# Patient Record
Sex: Male | Born: 1965 | Race: White | Hispanic: No | Marital: Married | Smoking: Former smoker
Health system: Southern US, Community
[De-identification: ages and names within clinical notes are randomized; demographics above are authoritative.]

## PROBLEM LIST (undated history)

## (undated) DIAGNOSIS — K2980 Duodenitis without bleeding: Secondary | ICD-10-CM

## (undated) DIAGNOSIS — Z91018 Allergy to other foods: Secondary | ICD-10-CM

## (undated) DIAGNOSIS — K219 Gastro-esophageal reflux disease without esophagitis: Secondary | ICD-10-CM

## (undated) DIAGNOSIS — K648 Other hemorrhoids: Secondary | ICD-10-CM

## (undated) DIAGNOSIS — K589 Irritable bowel syndrome without diarrhea: Secondary | ICD-10-CM

## (undated) DIAGNOSIS — C4490 Unspecified malignant neoplasm of skin, unspecified: Secondary | ICD-10-CM

## (undated) DIAGNOSIS — D126 Benign neoplasm of colon, unspecified: Secondary | ICD-10-CM

## (undated) DIAGNOSIS — K2970 Gastritis, unspecified, without bleeding: Secondary | ICD-10-CM

## (undated) HISTORY — DX: Gastritis, unspecified, without bleeding: K29.70

## (undated) HISTORY — DX: Allergy to other foods: Z91.018

## (undated) HISTORY — PX: CYSTOSCOPY: SUR368

## (undated) HISTORY — DX: Benign neoplasm of colon, unspecified: D12.6

## (undated) HISTORY — PX: HEMORRHOID BANDING: SHX5850

## (undated) HISTORY — DX: Irritable bowel syndrome, unspecified: K58.9

## (undated) HISTORY — DX: Unspecified malignant neoplasm of skin, unspecified: C44.90

## (undated) HISTORY — PX: OTHER SURGICAL HISTORY: SHX169

## (undated) HISTORY — PX: MULTIPLE TOOTH EXTRACTIONS: SHX2053

## (undated) HISTORY — PX: COLONOSCOPY: SHX174

## (undated) HISTORY — DX: Duodenitis without bleeding: K29.80

## (undated) HISTORY — DX: Other hemorrhoids: K64.8

---

## 2002-08-30 ENCOUNTER — Ambulatory Visit (HOSPITAL_BASED_OUTPATIENT_CLINIC_OR_DEPARTMENT_OTHER): Admission: RE | Admit: 2002-08-30 | Discharge: 2002-08-30 | Payer: Self-pay | Admitting: Urology

## 2005-04-28 ENCOUNTER — Emergency Department (HOSPITAL_COMMUNITY): Admission: EM | Admit: 2005-04-28 | Discharge: 2005-04-28 | Payer: Self-pay | Admitting: Emergency Medicine

## 2005-05-07 ENCOUNTER — Ambulatory Visit: Payer: Self-pay | Admitting: Internal Medicine

## 2005-05-08 ENCOUNTER — Encounter: Admission: RE | Admit: 2005-05-08 | Discharge: 2005-05-08 | Payer: Self-pay | Admitting: Internal Medicine

## 2005-05-21 ENCOUNTER — Ambulatory Visit: Payer: Self-pay | Admitting: Internal Medicine

## 2005-06-23 ENCOUNTER — Ambulatory Visit: Payer: Self-pay | Admitting: Internal Medicine

## 2005-06-30 ENCOUNTER — Ambulatory Visit: Payer: Self-pay | Admitting: Internal Medicine

## 2006-03-29 ENCOUNTER — Ambulatory Visit: Payer: Self-pay | Admitting: Internal Medicine

## 2006-03-30 ENCOUNTER — Encounter: Admission: RE | Admit: 2006-03-30 | Discharge: 2006-03-30 | Payer: Self-pay | Admitting: Internal Medicine

## 2006-05-05 ENCOUNTER — Ambulatory Visit: Payer: Self-pay | Admitting: Internal Medicine

## 2006-08-11 ENCOUNTER — Ambulatory Visit: Payer: Self-pay | Admitting: Internal Medicine

## 2006-12-30 ENCOUNTER — Ambulatory Visit: Payer: Self-pay | Admitting: Internal Medicine

## 2006-12-30 LAB — CONVERTED CEMR LAB: Triglycerides: 93 mg/dL (ref 0–149)

## 2007-12-07 ENCOUNTER — Ambulatory Visit: Payer: Self-pay | Admitting: Internal Medicine

## 2007-12-07 DIAGNOSIS — J069 Acute upper respiratory infection, unspecified: Secondary | ICD-10-CM | POA: Insufficient documentation

## 2007-12-07 DIAGNOSIS — F17200 Nicotine dependence, unspecified, uncomplicated: Secondary | ICD-10-CM | POA: Insufficient documentation

## 2008-01-17 ENCOUNTER — Telehealth (INDEPENDENT_AMBULATORY_CARE_PROVIDER_SITE_OTHER): Payer: Self-pay | Admitting: *Deleted

## 2008-01-17 ENCOUNTER — Emergency Department (HOSPITAL_COMMUNITY): Admission: EM | Admit: 2008-01-17 | Discharge: 2008-01-17 | Payer: Self-pay | Admitting: Emergency Medicine

## 2008-01-25 ENCOUNTER — Ambulatory Visit: Payer: Self-pay | Admitting: Internal Medicine

## 2008-01-25 LAB — CONVERTED CEMR LAB
ALT: 24 units/L (ref 0–53)
AST: 20 units/L (ref 0–37)
Albumin: 3.6 g/dL (ref 3.5–5.2)
Basophils Absolute: 0.1 10*3/uL (ref 0.0–0.1)
Cholesterol: 166 mg/dL (ref 0–200)
Creatinine, Ser: 0.9 mg/dL (ref 0.4–1.5)
Eosinophils Absolute: 0.2 10*3/uL (ref 0.0–0.7)
Eosinophils Relative: 2.2 % (ref 0.0–5.0)
GFR calc Af Amer: 118 mL/min
Glucose, Bld: 96 mg/dL (ref 70–99)
HCT: 46 % (ref 39.0–52.0)
HDL: 33.4 mg/dL — ABNORMAL LOW (ref >39.0)
LDL Cholesterol: 116 mg/dL — ABNORMAL HIGH (ref 0–99)
Lymphocytes Relative: 28.2 % (ref 12.0–46.0)
MCV: 93.7 fL (ref 78.0–100.0)
Neutro Abs: 5.8 10*3/uL (ref 1.4–7.7)
Neutrophils Relative %: 60.4 % (ref 43.0–77.0)
Potassium: 4 meq/L (ref 3.5–5.1)
RDW: 12.9 % (ref 11.5–14.6)
TSH: 1.13 microintl units/mL (ref 0.35–5.50)

## 2008-02-01 ENCOUNTER — Ambulatory Visit: Payer: Self-pay | Admitting: Internal Medicine

## 2008-02-01 DIAGNOSIS — R1319 Other dysphagia: Secondary | ICD-10-CM | POA: Insufficient documentation

## 2008-02-01 DIAGNOSIS — E785 Hyperlipidemia, unspecified: Secondary | ICD-10-CM

## 2008-02-01 DIAGNOSIS — E7800 Pure hypercholesterolemia, unspecified: Secondary | ICD-10-CM | POA: Insufficient documentation

## 2008-02-01 DIAGNOSIS — N318 Other neuromuscular dysfunction of bladder: Secondary | ICD-10-CM | POA: Insufficient documentation

## 2008-02-02 ENCOUNTER — Encounter (INDEPENDENT_AMBULATORY_CARE_PROVIDER_SITE_OTHER): Payer: Self-pay | Admitting: *Deleted

## 2008-03-06 ENCOUNTER — Encounter: Payer: Self-pay | Admitting: Internal Medicine

## 2009-12-26 ENCOUNTER — Ambulatory Visit: Payer: Self-pay | Admitting: Internal Medicine

## 2009-12-26 DIAGNOSIS — R1011 Right upper quadrant pain: Secondary | ICD-10-CM | POA: Insufficient documentation

## 2010-05-07 ENCOUNTER — Encounter: Payer: Self-pay | Admitting: Internal Medicine

## 2010-11-25 NOTE — Letter (Signed)
Summary: [facility]   Imported By: Lennie Odor 05/22/2010 11:46:20  _____________________________________________________________________  External Attachment:    Type:   Image     Comment:   External Document

## 2010-11-25 NOTE — Assessment & Plan Note (Signed)
Summary: UPPER ABD PAIN/RH......Stanley Petty   Vital Signs:  Patient profile:   45 year old male Weight:      167 pounds BMI:     21.23 Temp:     98.6 degrees F oral Pulse rate:   60 / minute Resp:     15 per minute BP sitting:   128 / 86  (left arm) Cuff size:   large  Vitals Entered By: Shonna Chock (December 26, 2009 1:32 PM) CC: Right side abdominal pain x 2 days. No known Injury, Abdominal pain Comments REVIEWED MED LIST, PATIENT AGREED DOSE AND INSTRUCTION CORRECT    CC:  Right side abdominal pain x 2 days. No known Injury and Abdominal pain.  History of Present Illness:  Abdominal Pain      This is a 45 year old man who presents with Abdominal pain < 24 hrs.  The patient denies nausea, vomiting, diarrhea, constipation, melena, hematochezia, anorexia, and hematemesis.  Stools were green X2. The location of the pain is right upper quadrant.  The pain is described as constant and dull pressure.  The patient denies the following symptoms: fever, weight loss, dysuria, chest pain, jaundice, and dark urine.  The pain is better with rest & with pressure R flank. No triggers. PMH of IBS;mother had GB disease & MGF had colon CA. Last colonoscopy 2003: tortuosity.GI consult 01/15/2010.  Allergies (verified): No Known Drug Allergies  Past History:  Past Medical History: IBS, PMH of  Review of Systems General:  Denies chills and sweats. GI:  Complains of gas; Flatulence X 1 month. GU:  Denies discharge and hematuria. Derm:  Denies lesion(s) and rash. Neuro:  Denies numbness and tingling; No radicular character.  Physical Exam  General:  Thin but well-nourished,in no acute distress; alert,appropriate and cooperative throughout examination Eyes:  No corneal or conjunctival inflammation noted.Perrla. No icterus  Mouth:  Oral mucosa and oropharynx without lesions or exudates.  Teeth in good repair. No pharyngeal erythema.  Mucocoele of uvula  Lungs:  Normal respiratory effort, chest expands  symmetrically. Lungs are clear to auscultation, no crackles or wheezes. Heart:  regular rhythm, no murmur, no gallop, no rub, no JVD, and bradycardia.   Abdomen:  Bowel sounds positive,abdomen soft and non-tender without masses, organomegaly or hernias noted. Skin:  Intact without suspicious lesions or rashes. No jaundice Cervical Nodes:  No lymphadenopathy noted Axillary Nodes:  No palpable lymphadenopathy Psych:  memory intact for recent and remote, normally interactive, and good eye contact.     Impression & Recommendations:  Problem # 1:  ABDOMINAL PAIN, RIGHT UPPER QUADRANT (ICD-789.01) ? hepatic flexure syndrome ; ? IBS variant  Complete Medication List: 1)  Over The Counter Meds  2)  Hyoscyamine Sulfate 0.125 Mg Subl (Hyoscyamine sulfate) .Stanley Petty.. 1 sl q 6 hrs as needed  Patient Instructions: 1)  Stick with a low residue diet and avoid foods that can irritate your bowels. 2)  Drink as much fluid as you can tolerate for the next few days. If symptoms persist : amylase, lipase, 3)  Hepatic Panel , 4)  CBC w/ Diff , 5)  Urine-dip . Prescriptions: HYOSCYAMINE SULFATE 0.125 MG SUBL (HYOSCYAMINE SULFATE) 1 SL q 6 hrs as needed  #30 x 1   Entered and Authorized by:   Marga Melnick MD   Signed by:   Marga Melnick MD on 12/26/2009   Method used:   Print then Give to Patient   RxID:   [id number]

## 2011-03-13 NOTE — Op Note (Signed)
NAME:  Stanley Petty, Stanley Petty                          ACCOUNT NO.:  [id number]   MEDICAL RECORD NO.:  [id number]                   PATIENT TYPE:  AMB   LOCATION:  [facility]                                 FACILITY:  [facility]   PHYSICIAN:  Valetta Fuller, M.D.               DATE OF BIRTH:  1966/06/20   DATE OF PROCEDURE:  DATE OF DISCHARGE:                                 OPERATIVE REPORT   PREOPERATIVE DIAGNOSIS:  Chronic pelvic pain syndrome.   POSTOPERATIVE DIAGNOSES:  1. Chronic pelvic pain syndrome.  2. Probable interstitial cystitis.   PROCEDURES:  1. Urethral dilation.  2. Cystoscopy.  3. Hydraulic overdistention of the bladder.  4. Instillation of Clorpactin, Marcaine, and Pyridium.   SURGEON:  Valetta Fuller, M.D.   ANESTHESIA:  General.   INDICATIONS:  The patient is a 45 year old male.  I have seen him for  several years with symptoms that were certainly suggestive of classic non-  infectious prostatitis.  He has had some objective prostatic inflammation in  the past but no evidence of infection on culture.  His symptoms have tended  to wax and wane and have been typical of chronic pelvic pain syndrome.  He  has had some frequency and other irritative voiding symptoms.  We discussed  the possibility that interstitial cystitis might be a subgroup of chronic  pelvic pain syndrome patients.  We also discussed the possibility of  cystoscopy to eliminate other pathology.  He has elected to proceed with  that evaluation for both diagnostic as well as therapeutic purposes.   DESCRIPTION OF PROCEDURE:  The patient was brought to the operating room,  where he had successful induction of general anesthesia.  He was placed in  the lithotomy position and prepped and draped in the usual manner.  The  meatus was somewhat stenotic and was therefore dilated to 26 [place].  A 22  French cystoscope was then inserted.  The patient had unremarkable anterior  urethra.  He had a fairly prominent  verumontanum but minimal lateral lobe  tissue.  There was a median bar, but it did not appear to be visually  obstructing.  The bladder showed some very mild trabecular change but was  otherwise unremarkable.  He underwent hydraulic overdistention to 80 ZOX0R  pressure for five minutes.  Capacity was 850 cc.  The patient did, indeed,  have 2-3+ diffuse glomerular hemorrhages in all four quadrants of his  bladder, and representative pictures of this were taken.  I repeated his  distention for an additional two to three minutes.  We then drained his  bladder.  We instilled some  Clorpactin under  a standard concentration via gravity drainage, a few hundred cubic  centimeters at a time.  The bladder was then drained and some Marcaine and  Pyridium were applied.  The patient was brought to the recovery room in  stable condition.  Valetta Fuller, M.D.    DSG/MEDQ  D:  08/30/2002  T:  08/30/2002  Job:  [id number]

## 2011-09-23 ENCOUNTER — Encounter: Payer: Self-pay | Admitting: Internal Medicine

## 2011-09-28 ENCOUNTER — Ambulatory Visit (INDEPENDENT_AMBULATORY_CARE_PROVIDER_SITE_OTHER): Payer: BC Managed Care – PPO | Admitting: Internal Medicine

## 2011-09-28 ENCOUNTER — Encounter: Payer: Self-pay | Admitting: Internal Medicine

## 2011-09-28 VITALS — BP 110/68 | HR 57 | Wt 167.8 lb

## 2011-09-28 DIAGNOSIS — F411 Generalized anxiety disorder: Secondary | ICD-10-CM

## 2011-09-28 DIAGNOSIS — R4589 Other symptoms and signs involving emotional state: Secondary | ICD-10-CM

## 2011-09-28 DIAGNOSIS — R197 Diarrhea, unspecified: Secondary | ICD-10-CM

## 2011-09-28 DIAGNOSIS — E785 Hyperlipidemia, unspecified: Secondary | ICD-10-CM

## 2011-09-28 DIAGNOSIS — K589 Irritable bowel syndrome without diarrhea: Secondary | ICD-10-CM

## 2011-09-28 LAB — CBC WITH DIFFERENTIAL/PLATELET
Basophils Absolute: 0.1 10*3/uL (ref 0.0–0.1)
HCT: 47.1 % (ref 39.0–52.0)
Lymphs Abs: 2.7 10*3/uL (ref 0.7–4.0)
MCHC: 33.8 g/dL (ref 30.0–36.0)
MCV: 93.9 fl (ref 78.0–100.0)
Monocytes Absolute: 0.8 10*3/uL (ref 0.1–1.0)
Platelets: 287 10*3/uL (ref 150.0–400.0)
WBC: 9.2 10*3/uL (ref 4.5–10.5)

## 2011-09-28 LAB — BASIC METABOLIC PANEL
Calcium: 9 mg/dL (ref 8.4–10.5)
GFR: 86.82 mL/min (ref >60.00)
Potassium: 4.6 mEq/L (ref 3.5–5.1)
Sodium: 140 mEq/L (ref 135–145)

## 2011-09-28 LAB — LIPID PANEL
Cholesterol: 167 mg/dL (ref 0–200)
HDL: 42.6 mg/dL (ref >39.00)
Total CHOL/HDL Ratio: 4
Triglycerides: 54 mg/dL (ref 0.0–149.0)

## 2011-09-28 LAB — TSH: TSH: 0.67 u[IU]/mL (ref 0.35–5.50)

## 2011-09-28 MED ORDER — CILIDINIUM-CHLORDIAZEPOXIDE 2.5-5 MG PO CAPS: 1.0000 | ORAL_CAPSULE | Freq: Three times a day (TID) | ORAL | Status: AC | PRN

## 2011-09-28 NOTE — Progress Notes (Signed)
Addended byPecola Lawless on: 09/28/2011 09:48 AM   Modules accepted: Orders

## 2011-09-28 NOTE — Patient Instructions (Addendum)
Please keep a diary of symptoms and signs. Enter significant symptoms, frequency, severity ( 1-10 scale), possible triggers, and response to medication, exercise or other therapeutic options as discussed.  Please take the probiotic , Align, every day until the bowels are normal. This will replace the normal bacteria which  are necessary for formation of normal stool and processing of food.  Consider  [facility]'s smoking cessation program @ [email or web address] or (548)402-2226.

## 2011-09-28 NOTE — Progress Notes (Signed)
  Subjective:    Patient ID: Stanley Petty, male    DOB: 02-22-1966, 45 y.o.   MRN: [id number]  HPI "STRESS": Onset:August 2012 with mood swings Triggers: wife diagnosed with  breast cancer 8/12 Course: progressive Anxiety:yes Depression:? 11/26 Loss of interest (Anhedonia):to some extent Panic attacks:no but 11/23 -26 as irritability ; crying spells 11/26 Insomnia:yes; up late  Anorexia:no Fatigue:yes Neurologic signs/symptoms: no headache, numbness and tingling, weakness Endocrinologic signs and symptoms:no hoarseness,  vision change, temperature intolerance, skin/hair,/nail changes. Some constipation until 11/26 then loose stool with frankly watery stool 11/26 & 12/2. Weight down ? # Family history of mental health issues, alcoholism or drug abuse:no Treatment/efficacy:no intervention        Review of Systems  He has an appointment to see his gastroenterologist, Dr. Kinnie Scales. PMH of IBS. With issues above; he has started smoking again. He denies any past medical history of similar symptoms.     Objective:   Physical Exam Gen.: Thin but healthy and well-nourished in appearance. Alert, appropriate and cooperative throughout exam. Eyes: No corneal or conjunctival inflammation noted.  Extraocular motion intact. No lid lag or proptosis. Mouth: Oral mucosa and oropharynx reveal mild erythema. Teeth in good repair. Neck: No deformities, masses, or tenderness noted. Range of motion &. Thyroid normal. Lungs: Normal respiratory effort; chest expands symmetrically. Lungs are clear to auscultation without rales, wheezes, or increased work of breathing. Heart: Normal rate and rhythm. Normal S1 and S2. No gallop, click, or rub. Faint Grade 1/2 systolic apical murmur. Abdomen: Bowel sounds normal; abdomen soft and nontender. No masses, organomegaly or hernias noted.                                                                                Musculoskeletal/extremities: No deformity or  scoliosis noted of  the thoracic or lumbar spine. No clubbing, cyanosis, edema, or deformity noted. Joints normal. Nail health  good. Vascular: Carotid, radial artery, dorsalis pedis and  posterior tibial pulses are full and equal. No bruits present. Neurologic: Alert and oriented x3. Deep tendon reflexes symmetrical and normal.          Skin: Intact without suspicious lesions or rashes. No jaundice Lymph: No cervical, axillary  lymphadenopathy present. Psych: Mood and affect are normal. Normally interactive                                                                                         Assessment & Plan:  #1 stress, exogenous. Options discussed  #2 irritable bowel syndrome ; exacerbation due to #1  #3 active smoker  Plan: See orders and recommendations.

## 2011-10-06 ENCOUNTER — Telehealth: Payer: Self-pay | Admitting: Internal Medicine

## 2011-10-06 ENCOUNTER — Other Ambulatory Visit: Payer: Self-pay | Admitting: Gastroenterology

## 2011-10-06 NOTE — Telephone Encounter (Signed)
Patient had lab [phone] - please address patient wants copy mailed

## 2011-10-12 ENCOUNTER — Ambulatory Visit
Admission: RE | Admit: 2011-10-12 | Discharge: 2011-10-12 | Disposition: A | Discharge status: Home / Self Care | Payer: BC Managed Care – PPO | Source: Ambulatory Visit | Attending: Gastroenterology | Admitting: Gastroenterology

## 2011-10-15 ENCOUNTER — Other Ambulatory Visit (HOSPITAL_COMMUNITY): Payer: Self-pay | Admitting: Gastroenterology

## 2011-11-06 ENCOUNTER — Encounter (HOSPITAL_COMMUNITY)
Admission: RE | Admit: 2011-11-06 | Discharge: 2011-11-06 | Disposition: A | Discharge status: Home / Self Care | Payer: BC Managed Care – PPO | Source: Ambulatory Visit | Attending: Gastroenterology | Admitting: Gastroenterology

## 2011-11-06 DIAGNOSIS — R109 Unspecified abdominal pain: Secondary | ICD-10-CM | POA: Insufficient documentation

## 2011-11-06 MED ORDER — TECHNETIUM TC 99M MEBROFENIN IV KIT
5.4000 | PACK | Freq: Once | INTRAVENOUS | Status: AC | PRN
  Administered 2011-11-06: 5 via INTRAVENOUS

## 2014-09-12 ENCOUNTER — Ambulatory Visit (INDEPENDENT_AMBULATORY_CARE_PROVIDER_SITE_OTHER): Payer: BC Managed Care – PPO | Admitting: Internal Medicine

## 2014-09-12 ENCOUNTER — Encounter: Payer: Self-pay | Admitting: Internal Medicine

## 2014-09-12 VITALS — BP 112/64 | HR 75 | Temp 98.0°F | Ht 76.0 in | Wt 169.2 lb

## 2014-09-12 DIAGNOSIS — Z9189 Other specified personal risk factors, not elsewhere classified: Secondary | ICD-10-CM

## 2014-09-12 DIAGNOSIS — Z0000 Encounter for general adult medical examination without abnormal findings: Secondary | ICD-10-CM | POA: Insufficient documentation

## 2014-09-12 NOTE — Progress Notes (Signed)
Subjective:    Patient ID: Stanley Petty, male    DOB: 1965-11-29, 48 y.o.   MRN: [id number]  DOS:  09/12/2014 Type of visit - description : CPX, new pt to me, transferring from Dr Linna Darner  Interval history: In addition to the CPX he has multiple concerns including a skin lesion at the neck, "wax in the ears". Wonders if he needs to be tested for Lyme disease, history of tick bites but no symptoms. Concern about his heart , stress test?   ROS  No  CP, SOB No palpitations Denies  nausea, vomiting diarrhea, blood in the stools (-) cough, sputum production (-) wheezing, chest congestion No anxiety, depression No headaches Denies dizziness     Past Medical History  Diagnosis Date  . IBS (irritable bowel syndrome)     Dr Earlean Shawl    Past Surgical History  Procedure Laterality Date  . Surgery on testicle      at age 35  . Cystoscopy      Dr Risa Grill    History   Social History  . Marital Status: Married    Spouse Name: N/A    Number of Children: 2  . Years of Education: N/A   Occupational History  . plumbing, has his own co    Social History Main Topics  . Smoking status: Current Every Day Smoker -- 1.00 packs/day  . Smokeless tobacco: Never Used  . Alcohol Use: 0.0 oz/week    0 Not specified per week     Comment: socially   . Drug Use: No  . Sexual Activity: Not on file   Other Topics Concern  . Not on file   Social History Narrative     Family History  Problem Relation Age of Onset  . Heart disease Father     MI  . Hyperlipidemia Sister   . Coronary artery disease Paternal Uncle   . Colon cancer Other     GF  . Colon polyps Mother     also gallstones  . Diabetes Neg Hx   . Prostate cancer Father        Medication List    Notice  As of 09/12/2014 11:59 PM   You have not been prescribed any medications.         Objective:   Physical Exam  Skin:      BP 112/64 mmHg  Pulse 75  Temp(Src) 98 F (36.7 C) (Oral)  Ht 6\' 4"  (1.93 m)  Wt  169 lb 4 oz (76.771 kg)  BMI 20.61 kg/m2  SpO2 97% General -- alert, well-developed, NAD.  Neck --no thyromegaly , normal carotid pulse  HEENT-- Not pale.  R Ear-- normal L ear-- normal Lungs -- normal respiratory effort, no intercostal retractions, no accessory muscle use, and normal breath sounds.  Heart-- normal rate, regular rhythm, no murmur.  Abdomen-- Not distended, good bowel sounds,soft, non-tender. Extremities-- no pretibial edema bilaterally  Neurologic--  alert & oriented X3. Speech normal, gait appropriate for age, strength symmetric and appropriate for age.  Psych-- Cognition and judgment appear intact. Cooperative with normal attention span and concentration. No anxious or depressed appearing.     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 Offered dermatology referral but he declined this time  Check for Lyme disease? I don't recommend that unless he has tick bite with  rash or any symptoms consistent with lyme  Wax in the ears?  No evidence of wax in this time

## 2014-09-12 NOTE — Assessment & Plan Note (Addendum)
Td ~ 2012 at ortho per pt  Declined a flu shot Labs including a hepatitis c screening per patient list Discussed diet and exercise  Prostate cancer screening: See Dr. Risa Grill regularly Had a colonoscopy 10-2011, 1 polyp, was told to repeat in 3-5 years. Patient not sure. GI is Dr. Cher Nakai  + family history of heart disease at early age, patient is a smoker. Concerned about his heart. Strongly recommend to quit tobacco ekg-- sinus brady Will get a Stress test

## 2014-09-12 NOTE — Progress Notes (Signed)
Pre visit review using our clinic review tool, if applicable. No additional management support is needed unless otherwise documented below in the visit note. 

## 2014-09-13 ENCOUNTER — Telehealth: Payer: Self-pay | Admitting: Internal Medicine
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
## 2014-09-13 NOTE — Telephone Encounter (Signed)
Pt was seen yesterday, states dr. Larose Kells told him to come back and get his labs done, order not in the system.

## 2014-09-13 NOTE — Telephone Encounter (Signed)
Orders are in now

## 2014-09-13 NOTE — Telephone Encounter (Signed)
emmi emailed °
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
## 2014-09-14 ENCOUNTER — Other Ambulatory Visit (INDEPENDENT_AMBULATORY_CARE_PROVIDER_SITE_OTHER): Payer: BC Managed Care – PPO

## 2014-09-14 ENCOUNTER — Encounter: Payer: Self-pay | Admitting: Internal Medicine

## 2014-09-14 DIAGNOSIS — Z0000 Encounter for general adult medical examination without abnormal findings: Secondary | ICD-10-CM

## 2014-09-14 LAB — COMPREHENSIVE METABOLIC PANEL
ALT: 19 U/L (ref 0–53)
AST: 19 U/L (ref 0–37)
Albumin: 3.9 g/dL (ref 3.5–5.2)
Alkaline Phosphatase: 72 U/L (ref 39–117)
BUN: 13 mg/dL (ref 6–23)
CALCIUM: 8.8 mg/dL (ref 8.4–10.5)
CO2: 26 mEq/L (ref 19–32)
CREATININE: 1.1 mg/dL (ref 0.4–1.5)
Chloride: 108 mEq/L (ref 96–112)
GFR: 78.34 mL/min (ref >60.00)
Glucose, Bld: 96 mg/dL (ref 70–99)
Potassium: 3.9 mEq/L (ref 3.5–5.1)
SODIUM: 139 meq/L (ref 135–145)
Total Bilirubin: 0.4 mg/dL (ref 0.2–1.2)
Total Protein: 6.5 g/dL (ref 6.0–8.3)

## 2014-09-14 LAB — LIPID PANEL
Cholesterol: 180 mg/dL (ref 0–200)
HDL: 45.6 mg/dL (ref >39.00)
LDL Cholesterol: 126 mg/dL — ABNORMAL HIGH (ref 0–99)
NonHDL: 134.4
Total CHOL/HDL Ratio: 4
Triglycerides: 44 mg/dL (ref 0.0–149.0)
VLDL: 8.8 mg/dL (ref 0.0–40.0)

## 2014-09-14 LAB — CBC WITH DIFFERENTIAL/PLATELET
Basophils Absolute: 0.1 10*3/uL (ref 0.0–0.1)
Basophils Relative: 0.6 % (ref 0.0–3.0)
Eosinophils Absolute: 0.2 10*3/uL (ref 0.0–0.7)
Eosinophils Relative: 2.4 % (ref 0.0–5.0)
HEMATOCRIT: 46.5 % (ref 39.0–52.0)
HEMOGLOBIN: 15.7 g/dL (ref 13.0–17.0)
Lymphocytes Relative: 30.7 % (ref 12.0–46.0)
Lymphs Abs: 2.8 10*3/uL (ref 0.7–4.0)
MCHC: 33.8 g/dL (ref 30.0–36.0)
MCV: 93.4 fl (ref 78.0–100.0)
MONOS PCT: 7.7 % (ref 3.0–12.0)
Monocytes Absolute: 0.7 10*3/uL (ref 0.1–1.0)
Neutro Abs: 5.3 10*3/uL (ref 1.4–7.7)
Neutrophils Relative %: 58.6 % (ref 43.0–77.0)
Platelets: 237 10*3/uL (ref 150.0–400.0)
RBC: 4.98 Mil/uL (ref 4.22–5.81)
RDW: 13.9 % (ref 11.5–15.5)
WBC: 9 10*3/uL (ref 4.0–10.5)

## 2014-09-14 LAB — TSH: TSH: 0.83 u[IU]/mL (ref 0.35–4.50)

## 2014-10-12 ENCOUNTER — Encounter (HOSPITAL_COMMUNITY): Payer: BC Managed Care – PPO

## 2014-11-02 ENCOUNTER — Telehealth (HOSPITAL_COMMUNITY): Payer: Self-pay

## 2014-11-02 NOTE — Telephone Encounter (Signed)
Encounter complete. 

## 2014-11-06 ENCOUNTER — Telehealth (HOSPITAL_COMMUNITY): Payer: Self-pay

## 2014-11-06 NOTE — Telephone Encounter (Signed)
Encounter complete. 

## 2014-11-07 ENCOUNTER — Ambulatory Visit (HOSPITAL_COMMUNITY)
Admission: RE | Admit: 2014-11-07 | Discharge: 2014-11-07 | Disposition: A | Discharge status: Home / Self Care | Payer: BLUE CROSS/BLUE SHIELD | Source: Ambulatory Visit | Attending: Internal Medicine | Admitting: Internal Medicine

## 2014-11-07 DIAGNOSIS — Z9189 Other specified personal risk factors, not elsewhere classified: Secondary | ICD-10-CM

## 2014-11-07 NOTE — Procedures (Signed)
Exercise Treadmill Test    Test  Exercise Tolerance Test Ordering MD: Unice Cobble, MD  Interpreting MD:   Unique Test No1 Treadmill:  1  Indication for ETT: Family History of CAD-At Risk for Heart Disease  Contraindication to ETT: No   Stress Modality: exercise - treadmill  Cardiac Imaging Performed: non   Protocol: standard Bruce - maximal  Max BP:  171/92  Max MPHR (bpm): 170 85% MPR (bpm): 144  MPHR obtained (bpm): 173 % MPHR obtained:  101  Reached 85% MPHR (min:sec): 11:15 Total Exercise Time (min-sec): 13:02  Workload in METS:  15.30 Borg Scale:   Reason ETT Terminated: leg fatigue    ST Segment Analysis At Rest: normal ST segments - no evidence of significant ST depression With Exercise: no evidence of significant ST depression  Other Information Arrhythmia:  No Angina during ETT:  absent (0) Quality of ETT:  diagnostic  ETT Interpretation:  normal - no evidence of ischemia by ST analysis  Comments: Excellent exercise tolerance No chest pain No ischemic EKG changes Duke Treadmill Score of +13  Pixie Casino, MD, [facility] Attending Cardiologist [place]

## 2015-03-12 ENCOUNTER — Encounter: Payer: Self-pay | Admitting: Internal Medicine

## 2015-03-12 ENCOUNTER — Ambulatory Visit (INDEPENDENT_AMBULATORY_CARE_PROVIDER_SITE_OTHER): Payer: BLUE CROSS/BLUE SHIELD | Admitting: Internal Medicine

## 2015-03-12 VITALS — BP 120/76 | HR 74 | Temp 98.2°F | Ht 76.0 in | Wt 163.2 lb

## 2015-03-12 DIAGNOSIS — J0100 Acute maxillary sinusitis, unspecified: Secondary | ICD-10-CM | POA: Diagnosis not present

## 2015-03-12 MED ORDER — AZITHROMYCIN 250 MG PO TABS: ORAL_TABLET | ORAL | Status: DC

## 2015-03-12 NOTE — Progress Notes (Signed)
   Subjective:    Patient ID: Stanley Petty, male    DOB: 12/27/1965, 49 y.o.   MRN: [id number]  DOS:  03/12/2015 Type of visit - description : acute Interval history: Symptoms started about 10 days ago with runny nose, single episode of diarrhea and in general feeling poorly like he had a cold. 3-4 days ago developed a very thick/green nasal discharge and right facial pain but no dental pain per se. Yesterday he had a headache. This morning feels overall better, a right facial pain and headache are much decrease, he still has some nasal discharge. At least one of his coworkers had similar symptoms.   Review of Systems  Denies fever, chills? No sore throat. Mild cough with no sputum production No double vision.  Past Medical History  Diagnosis Date  . IBS (irritable bowel syndrome)     Dr Earlean Shawl    Past Surgical History  Procedure Laterality Date  . Surgery on testicle      at age 47  . Cystoscopy      Dr Risa Grill    History   Social History  . Marital Status: Married    Spouse Name: N/A  . Number of Children: 2  . Years of Education: N/A   Occupational History  . plumbing, has his own co    Social History Main Topics  . Smoking status: Current Every Day Smoker -- 1.00 packs/day  . Smokeless tobacco: Never Used  . Alcohol Use: 0.0 oz/week    0 Standard drinks or equivalent per week     Comment: socially   . Drug Use: No  . Sexual Activity: Not on file   Other Topics Concern  . Not on file   Social History Narrative        Medication List       This list is accurate as of: 03/12/15  3:36 PM.  Always use your most recent med list.               azithromycin 250 MG tablet  Commonly known as:  ZITHROMAX Z-PAK  2 tabs a day the first day, then 1 tab a day x 4 days           Objective:   Physical Exam BP 120/76 mmHg  Pulse 74  Temp(Src) 98.2 F (36.8 C) (Oral)  Ht 6\' 4"  (1.93 m)  Wt 163 lb 4 oz (74.05 kg)  BMI 19.88 kg/m2  SpO2 97% General:    Well developed, well nourished . NAD.  HEENT:  Normocephalic . Face symmetric, atraumatic. No systolic congested, maxillary sinuses not TTP. Tympanic members normal, throat symmetric, no redness or discharge. EOMI Lungs:  CTA B Normal respiratory effort, no intercostal retractions, no accessory muscle use. Heart: RRR,  no murmur.  no pretibial edema bilaterally  Neurologic:  alert & oriented X3.  Speech normal, gait appropriate for age and unassisted Psych--  Cognition and judgment appear intact.  Cooperative with normal attention span and concentration.  Behavior appropriate. No anxious or depressed appearing.       Assessment & Plan:    Sinusitis, Most likely symptoms related to mild sinusitis. See instructions. If not better she will let me know.  + Family history of heart disease, stress test 10-2014 negative

## 2015-03-12 NOTE — Progress Notes (Signed)
Pre visit review using our clinic review tool, if applicable. No additional management support is needed unless otherwise documented below in the visit note. 

## 2015-03-12 NOTE — Patient Instructions (Signed)
Rest, fluids , tylenol  take Mucinex   twice a day until better    use OTC Nasocort or Flonase : 2 nasal sprays on each side of the nose daily until you feel better   Take the antibiotic as prescribed  (zithromax) Call if not gradually better over the next  10 days Call anytime if the symptoms are severe

## 2016-02-14 ENCOUNTER — Telehealth: Payer: Self-pay | Admitting: Behavioral Health

## 2016-02-14 NOTE — Telephone Encounter (Signed)
Unable to reach patient at time of Pre-Visit Call.  Left message for patient to return call when available.    

## 2016-02-17 ENCOUNTER — Ambulatory Visit (INDEPENDENT_AMBULATORY_CARE_PROVIDER_SITE_OTHER): Payer: BLUE CROSS/BLUE SHIELD | Admitting: Internal Medicine

## 2016-02-17 ENCOUNTER — Encounter: Payer: Self-pay | Admitting: Internal Medicine

## 2016-02-17 VITALS — BP 124/74 | HR 55 | Temp 97.9°F | Resp 14 | Ht 76.0 in | Wt 170.6 lb

## 2016-02-17 DIAGNOSIS — Z0000 Encounter for general adult medical examination without abnormal findings: Secondary | ICD-10-CM | POA: Diagnosis not present

## 2016-02-17 DIAGNOSIS — Z09 Encounter for follow-up examination after completed treatment for conditions other than malignant neoplasm: Secondary | ICD-10-CM | POA: Insufficient documentation

## 2016-02-17 LAB — CBC WITH DIFFERENTIAL/PLATELET
BASOS PCT: 0.6 % (ref 0.0–3.0)
Basophils Absolute: 0.1 10*3/uL (ref 0.0–0.1)
EOS ABS: 0.2 10*3/uL (ref 0.0–0.7)
Eosinophils Relative: 2.3 % (ref 0.0–5.0)
HEMATOCRIT: 47.3 % (ref 39.0–52.0)
Hemoglobin: 15.6 g/dL (ref 13.0–17.0)
LYMPHS ABS: 2.8 10*3/uL (ref 0.7–4.0)
LYMPHS PCT: 27.5 % (ref 12.0–46.0)
MCHC: 33.1 g/dL (ref 30.0–36.0)
MCV: 93.3 fl (ref 78.0–100.0)
MONO ABS: 0.8 10*3/uL (ref 0.1–1.0)
Monocytes Relative: 7.5 % (ref 3.0–12.0)
NEUTROS ABS: 6.4 10*3/uL (ref 1.4–7.7)
Neutrophils Relative %: 62.1 % (ref 43.0–77.0)
PLATELETS: 255 10*3/uL (ref 150.0–400.0)
RBC: 5.07 Mil/uL (ref 4.22–5.81)
RDW: 14 % (ref 11.5–15.5)
WBC: 10.3 10*3/uL (ref 4.0–10.5)

## 2016-02-17 LAB — BASIC METABOLIC PANEL
BUN: 11 mg/dL (ref 6–23)
CHLORIDE: 105 meq/L (ref 96–112)
CO2: 26 meq/L (ref 19–32)
Calcium: 9.4 mg/dL (ref 8.4–10.5)
Creatinine, Ser: 0.95 mg/dL (ref 0.40–1.50)
GFR: 88.4 mL/min (ref >60.00)
Glucose, Bld: 90 mg/dL (ref 70–99)
POTASSIUM: 4.8 meq/L (ref 3.5–5.1)
SODIUM: 139 meq/L (ref 135–145)

## 2016-02-17 LAB — LIPID PANEL
CHOL/HDL RATIO: 4
Cholesterol: 182 mg/dL (ref 0–200)
HDL: 40.8 mg/dL (ref >39.00)
LDL CALC: 121 mg/dL — AB (ref 0–99)
NonHDL: 141.1
TRIGLYCERIDES: 100 mg/dL (ref 0.0–149.0)
VLDL: 20 mg/dL (ref 0.0–40.0)

## 2016-02-17 LAB — PSA: PSA: 0.67 ng/mL (ref 0.10–4.00)

## 2016-02-17 NOTE — Assessment & Plan Note (Addendum)
Td ~ 2012 at ortho per pt   Prostate cancer screening: See Dr. Risa Grill regularly, no recent visit but pt states he will see him this year. Pt request a PSA (will fax to urology) Had a colonoscopy 10-2011, 1 polyp, was told to repeat in 3-5 years. Saw  Dr. Cher Nakai 2 weeks ago per pt, to schedule a cscope soon  + family history of heart disease at early age, patient is a smoker-- exercise stress test (-) 2016 LDL goal close to 100. Start ASA  Tobacco cessation discussed !   He remains active, no routine exercise, encouraged healthy diet.

## 2016-02-17 NOTE — Progress Notes (Signed)
Pre visit review using our clinic review tool, if applicable. No additional management support is needed unless otherwise documented below in the visit note. 

## 2016-02-17 NOTE — Assessment & Plan Note (Signed)
Hemorrhoids: Recent rectal bleeding, to have a colonoscopy per GI soon RTC one year

## 2016-02-17 NOTE — Progress Notes (Signed)
Subjective:    Patient ID: Stanley Petty, male    DOB: 27-May-1966, 50 y.o.   MRN: [id number]  DOS:  02/17/2016 Type of visit - description : CPX Interval history No major concerns, still smoking.     Review of Systems Constitutional: No fever. No chills. No unexplained wt changes. No unusual sweats  HEENT: No dental problems, no ear discharge, no facial swelling, no voice changes. No eye discharge, no eye  redness , no  intolerance to light   Respiratory: No wheezing , no  difficulty breathing. No cough , no mucus production  Cardiovascular: No CP, no leg swelling , no  Palpitations  GI: no nausea, no vomiting, no diarrhea , no  abdominal pain.  Occasional rectal bleeding, already discuss with GI, they are planning a colonoscopy. No dysphagia, no odynophagia    Endocrine: No polyphagia, no polyuria , no polydipsia  GU: No dysuria, gross hematuria, difficulty urinating. No urinary urgency, no frequency.  Musculoskeletal: No joint swellings or unusual aches or pains  Skin: No change in the color of the skin, palor , no  Rash  Allergic, immunologic: No environmental allergies , no  food allergies  Neurological: No dizziness no  syncope. No headaches. No diplopia, no slurred, no slurred speech, no motor deficits, no facial  Numbness  Hematological: No enlarged lymph nodes, no easy bruising , no unusual bleedings  Psychiatry: No suicidal ideas, no hallucinations, no beavior problems, no confusion.  No unusual/severe anxiety, no depression   Past Medical History  Diagnosis Date  . IBS (irritable bowel syndrome)     Dr Earlean Shawl    Past Surgical History  Procedure Laterality Date  . Surgery on testicle      at age 10  . Cystoscopy      Dr Risa Grill    Social History   Social History  . Marital Status: Married    Spouse Name: N/A  . Number of Children: 2  . Years of Education: N/A   Occupational History  . plumbing, has his own co    Social History Main Topics    . Smoking status: Current Every Day Smoker -- 1.00 packs/day    Types: Cigarettes  . Smokeless tobacco: Never Used     Comment: 1 ppd  . Alcohol Use: 0.0 oz/week    0 Standard drinks or equivalent per week     Comment: socially   . Drug Use: No  . Sexual Activity: Not on file   Other Topics Concern  . Not on file   Social History Narrative     Family History  Problem Relation Age of Onset  . Heart disease Father     MI  . Hyperlipidemia Sister   . Coronary artery disease Paternal Uncle   . Colon cancer Other     GF  . Colon polyps Mother     also gallstones  . Diabetes Neg Hx   . Prostate cancer Father     dx ~ 80       Medication List    Notice  As of 02/17/2016 12:48 PM   You have not been prescribed any medications.         Objective:   Physical Exam BP 124/74 mmHg  Pulse 55  Temp(Src) 97.9 F (36.6 C) (Oral)  Resp 14  Ht 6\' 4"  (1.93 m)  Wt 170 lb 9.6 oz (77.384 kg)  BMI 20.77 kg/m2  SpO2 98%  General:   Well developed, well nourished .  NAD.  Neck: No  Thyromegaly  HEENT:  Normocephalic . Face symmetric, atraumatic Lungs:  CTA B Normal respiratory effort, no intercostal retractions, no accessory muscle use. Heart: RRR,  no murmur.  No pretibial edema bilaterally  Abdomen:  Not distended, soft, non-tender. No rebound or rigidity.   Skin: Exposed areas without rash. Not pale. Not jaundice Neurologic:  alert & oriented X3.  Speech normal, gait appropriate for age and unassisted Strength symmetric and appropriate for age.  Psych: Cognition and judgment appear intact.  Cooperative with normal attention span and concentration.  Behavior appropriate. No anxious or depressed appearing.    Assessment & Plan:   Assessment IBS, Dr. Cher Nakai Hemorrhoids, h/o banding   PLAN Hemorrhoids: Recent rectal bleeding, to have a colonoscopy per GI soon RTC one year

## 2016-02-17 NOTE — Patient Instructions (Signed)
GO TO THE LAB :      Get the blood work     GO TO THE FRONT DESK  Schedule your next appointment for a  physical exam, fasting in one year  Think about the gum or   patch for nicotine supplements, Chantix or Wellbutrin   Start an aspirin 81 mg daily     Steps to Quit Smoking  Smoking tobacco can be harmful to your health and can affect almost every organ in your body. Smoking puts you, and those around you, at risk for developing many serious chronic diseases. Quitting smoking is difficult, but it is one of the best things that you can do for your health. It is never too late to quit. WHAT ARE THE BENEFITS OF QUITTING SMOKING? When you quit smoking, you lower your risk of developing serious diseases and conditions, such as:  Lung cancer or lung disease, such as COPD.  Heart disease.  Stroke.  Heart attack.  Infertility.  Osteoporosis and bone fractures. Additionally, symptoms such as coughing, wheezing, and shortness of breath may get better when you quit. You may also find that you get sick less often because your body is stronger at fighting off colds and infections. If you are pregnant, quitting smoking can help to reduce your chances of having a baby of low birth weight. HOW DO I GET READY TO QUIT? When you decide to quit smoking, create a plan to make sure that you are successful. Before you quit:  Pick a date to quit. Set a date within the next two weeks to give you time to prepare.  Write down the reasons why you are quitting. Keep this list in places where you will see it often, such as on your bathroom mirror or in your car or wallet.  Identify the people, places, things, and activities that make you want to smoke (triggers) and avoid them. Make sure to take these actions:  Throw away all cigarettes at home, at work, and in your car.  Throw away smoking accessories, such as Scientist, research (medical).  Clean your car and make sure to empty the ashtray.  Clean your  home, including curtains and carpets.  Tell your family, friends, and coworkers that you are quitting. Support from your loved ones can make quitting easier.  Talk with your health care provider about your options for quitting smoking.  Find out what treatment options are covered by your health insurance. WHAT STRATEGIES CAN I USE TO QUIT SMOKING?  Talk with your healthcare provider about different strategies to quit smoking. Some strategies include:  Quitting smoking altogether instead of gradually lessening how much you smoke over a period of time. Research shows that quitting "cold [place]" is more successful than gradually quitting.  Attending in-person counseling to help you build problem-solving skills. You are more likely to have success in quitting if you attend several counseling sessions. Even short sessions of 10 minutes can be effective.  Finding resources and support systems that can help you to quit smoking and remain smoke-free after you quit. These resources are most helpful when you use them often. They can include:  Online chats with a Social worker.  Telephone quitlines.  Printed Furniture conservator/restorer.  Support groups or group counseling.  Text messaging programs.  Mobile phone applications.  Taking medicines to help you quit smoking. (If you are pregnant or breastfeeding, talk with your health care provider first.) Some medicines contain nicotine and some do not. Both types of medicines help  with cravings, but the medicines that include nicotine help to relieve withdrawal symptoms. Your health care provider may recommend:  Nicotine patches, gum, or lozenges.  Nicotine inhalers or sprays.  Non-nicotine medicine that is taken by mouth. Talk with your health care provider about combining strategies, such as taking medicines while you are also receiving in-person counseling. Using these two strategies together makes you more likely to succeed in quitting than if you used  either strategy on its own. If you are pregnant or breastfeeding, talk with your health care provider about finding counseling or other support strategies to quit smoking. Do not take medicine to help you quit smoking unless told to do so by your health care provider. WHAT THINGS CAN I DO TO MAKE IT EASIER TO QUIT? Quitting smoking might feel overwhelming at first, but there is a lot that you can do to make it easier. Take these important actions:  Reach out to your family and friends and ask that they support and encourage you during this time. Call telephone quitlines, reach out to support groups, or work with a counselor for support.  Ask people who smoke to avoid smoking around you.  Avoid places that trigger you to smoke, such as bars, parties, or smoke-break areas at work.  Spend time around people who do not smoke.  Lessen stress in your life, because stress can be a smoking trigger for some people. To lessen stress, try:  Exercising regularly.  Deep-breathing exercises.  Yoga.  Meditating.  Performing a body scan. This involves closing your eyes, scanning your body from head to toe, and noticing which parts of your body are particularly tense. Purposefully relax the muscles in those areas.  Download or purchase mobile phone or tablet apps (applications) that can help you stick to your quit plan by providing reminders, tips, and encouragement. There are many free apps, such as QuitGuide from the [employer] Office manager for Disease Control and Prevention). You can find other support for quitting smoking (smoking cessation) through smokefree.gov and other websites. HOW WILL I FEEL WHEN I QUIT SMOKING? Within the first 24 hours of quitting smoking, you may start to feel some withdrawal symptoms. These symptoms are usually most noticeable 2-3 days after quitting, but they usually do not last beyond 2-3 weeks. Changes or symptoms that you might experience include:  Mood swings.  Restlessness,  anxiety, or irritation.  Difficulty concentrating.  Dizziness.  Strong cravings for sugary foods in addition to nicotine.  Mild weight gain.  Constipation.  Nausea.  Coughing or a sore throat.  Changes in how your medicines work in your body.  A depressed mood.  Difficulty sleeping (insomnia). After the first 2-3 weeks of quitting, you may start to notice more positive results, such as:  Improved sense of smell and taste.  Decreased coughing and sore throat.  Slower heart rate.  Lower blood pressure.  Clearer skin.  The ability to breathe more easily.  Fewer sick days. Quitting smoking is very challenging for most people. Do not get discouraged if you are not successful the first time. Some people need to make many attempts to quit before they achieve long-term success. Do your best to stick to your quit plan, and talk with your health care provider if you have any questions or concerns.   This information is not intended to replace advice given to you by your health care provider. Make sure you discuss any questions you have with your health care provider.   Document Released: 10/06/2001  Document Revised: 02/26/2015 Document Reviewed: 02/26/2015 Elsevier Interactive Patient Education [employer].

## 2016-02-18 ENCOUNTER — Encounter: Payer: Self-pay | Admitting: *Deleted

## 2016-04-30 ENCOUNTER — Ambulatory Visit (INDEPENDENT_AMBULATORY_CARE_PROVIDER_SITE_OTHER): Payer: BLUE CROSS/BLUE SHIELD | Admitting: Family

## 2016-04-30 ENCOUNTER — Encounter: Payer: Self-pay | Admitting: Family

## 2016-04-30 VITALS — BP 124/70 | HR 64 | Temp 98.3°F | Ht 76.0 in | Wt 163.1 lb

## 2016-04-30 DIAGNOSIS — W57XXXA Bitten or stung by nonvenomous insect and other nonvenomous arthropods, initial encounter: Secondary | ICD-10-CM

## 2016-04-30 DIAGNOSIS — T148 Other injury of unspecified body region: Secondary | ICD-10-CM

## 2016-04-30 MED ORDER — CEPHALEXIN 500 MG PO CAPS: 500.0000 mg | ORAL_CAPSULE | Freq: Three times a day (TID) | ORAL | Status: DC

## 2016-04-30 MED ORDER — DOXYCYCLINE HYCLATE 100 MG PO TABS: 100.0000 mg | ORAL_TABLET | Freq: Two times a day (BID) | ORAL | Status: DC

## 2016-04-30 NOTE — Patient Instructions (Addendum)
Begin Keflex.   Call if you develop increased swelling, redness or pain near the bite. Call if you develop fever or new rash.

## 2016-04-30 NOTE — Progress Notes (Signed)
   Subjective:    Patient ID: Stanley Petty, male    DOB: [birth year], 50 y.o.   MRN: [id number]  HPI  Stanley Petty is a 50 yr old male who presents today to discuss tick bite on the left index finger 04/28/16.  Was a "long star tick." Reports some local swelling, itching of the affected finger. He denies fever, rash or joint pain.    Review of Systems    see HPI  Past Medical History  Diagnosis Date  . IBS (irritable bowel syndrome)     Dr Earlean Shawl     Social History   Social History  . Marital Status: Married    Spouse Name: N/A  . Number of Children: 2  . Years of Education: N/A   Occupational History  . plumbing, has his own co    Social History Main Topics  . Smoking status: Current Every Day Smoker -- 1.00 packs/day    Types: Cigarettes  . Smokeless tobacco: Never Used     Comment: 1 ppd  . Alcohol Use: 0.0 oz/week    0 Standard drinks or equivalent per week     Comment: socially   . Drug Use: No  . Sexual Activity: Not on file   Other Topics Concern  . Not on file   Social History Narrative    Past Surgical History  Procedure Laterality Date  . Surgery on testicle      at age 89  . Cystoscopy      Dr Risa Grill    Family History  Problem Relation Age of Onset  . Heart disease Father     MI  . Hyperlipidemia Sister   . Coronary artery disease Paternal Uncle   . Colon cancer Other     GF  . Colon polyps Mother     also gallstones  . Diabetes Neg Hx   . Prostate cancer Father     dx ~ 33    No Known Allergies  No current outpatient prescriptions on file prior to visit.   No current facility-administered medications on file prior to visit.    BP 124/70 mmHg  Pulse 64  Temp(Src) 98.3 F (36.8 C) (Oral)  Ht 6\' 4"  (1.93 m)  Wt 163 lb 2 oz (73.993 kg)  BMI 19.86 kg/m2  SpO2 97%    Objective:   Physical Exam  Constitutional: He is oriented to person, place, and time. He appears well-developed and well-nourished.  Neurological: He is alert  and oriented to person, place, and time.  Skin:  Bite noted at base of the right index finger.  Mild associated swelling of the right index finger and base of finger  Psychiatric: He has a normal mood and affect. His behavior is normal. Judgment and thought content normal.          Assessment & Plan:  Tick Bite- pt appears to have a mild local reaction to bite.  I did send rx for keflex for empiric cellulitis treatment ( cancelled initial rx for doxy because he is headed to the beach and is concerned re: photosensitivity).  Advised pt to let us know if increased pain/redness/swelling by the site. Call if new rash, joint pain or fever occurs.  We did discuss rare development of meat allergy in patient's who have been bitten by this type of tick and he is advised to let us know if he has any issues when he eats meat. Pt verbalizes understanding.

## 2016-04-30 NOTE — Progress Notes (Signed)
Pre visit review using our clinic review tool, if applicable. No additional management support is needed unless otherwise documented below in the visit note. 

## 2016-06-15 ENCOUNTER — Encounter: Payer: Self-pay | Admitting: Medical

## 2016-06-15 ENCOUNTER — Ambulatory Visit (INDEPENDENT_AMBULATORY_CARE_PROVIDER_SITE_OTHER): Payer: BLUE CROSS/BLUE SHIELD | Admitting: Medical

## 2016-06-15 VITALS — BP 129/88 | HR 58 | Temp 98.5°F | Ht 76.0 in | Wt 165.0 lb

## 2016-06-15 DIAGNOSIS — R51 Headache: Secondary | ICD-10-CM

## 2016-06-15 DIAGNOSIS — W57XXXA Bitten or stung by nonvenomous insect and other nonvenomous arthropods, initial encounter: Secondary | ICD-10-CM

## 2016-06-15 DIAGNOSIS — R519 Headache, unspecified: Secondary | ICD-10-CM

## 2016-06-15 DIAGNOSIS — T148 Other injury of unspecified body region: Secondary | ICD-10-CM | POA: Diagnosis not present

## 2016-06-15 DIAGNOSIS — R42 Dizziness and giddiness: Secondary | ICD-10-CM | POA: Diagnosis not present

## 2016-06-15 DIAGNOSIS — R001 Bradycardia, unspecified: Secondary | ICD-10-CM

## 2016-06-15 DIAGNOSIS — R5383 Other fatigue: Secondary | ICD-10-CM

## 2016-06-15 LAB — TROPONIN I: Troponin I: <0.01 ng/mL (ref <=0.05)

## 2016-06-15 NOTE — Progress Notes (Signed)
Subjective:    Patient ID: Stanley Petty, male    DOB: 04/20/1966, 50 y.o.   MRN: [id number]  HPI  Pt in with hx of tick on his finger. I reviewed last note. Pt never took antibiotics. No tick bite studies ever done.  Pt states on wed this week he had day dreamy type sensation. He felt slight dizzy traniently. He sates felt weird. His fsbs was 87. His mom checked. Pt states pulse was 60.  Pt bp was normal. Pt was not doing anything strenuous.  Then on Thursday went on camping trip. Friday was camping. Late in afternoon installed waterheater. Got home resting and felt slight light headed sensation. Though he has hard time describing. Pt slept a lot on Friday and Saturday. On Sunday he was feeling ok. He had third episode of faint light headed and feeling weak.   Today talking with dad he had 4th light headed and maybe weak sensation.  Pt has faint neck pain that he just noticed today. But also a headache that is faint in rt temporal area.   Pt had no chest pain or palpitation that precedes this symptoms. Pt had negat treadmill test 11-07-2014.  No history of head trauma.   Review of Systems  Constitutional: Negative for chills, fatigue and fever.  HENT: Negative for congestion.   Eyes: Negative for pain and itching.       Slight vision feels disturbed.  Respiratory: Negative for choking, chest tightness, shortness of breath and wheezing.   Cardiovascular: Negative for chest pain and palpitations.  Gastrointestinal: Negative for abdominal pain.  Musculoskeletal: Positive for neck pain. Negative for back pain.  Skin: Negative for rash.  Neurological: Positive for dizziness and headaches.       See hpi. Just noticed the neck pain and rt temporal area noticed today.   Hematological: Negative for adenopathy. Does not bruise/bleed easily.   Past Medical History:  Diagnosis Date  . IBS (irritable bowel syndrome)    Dr Earlean Shawl     Social History   Social History  . Marital status:  Married    Spouse name: N/A  . Number of children: 2  . Years of education: N/A   Occupational History  . plumbing, has his own co    Social History Main Topics  . Smoking status: Current Every Day Smoker    Packs/day: 1.00    Types: Cigarettes  . Smokeless tobacco: Never Used     Comment: 1 ppd  . Alcohol use 0.0 oz/week     Comment: socially   . Drug use: No  . Sexual activity: Not on file   Other Topics Concern  . Not on file   Social History Narrative  . No narrative on file    Past Surgical History:  Procedure Laterality Date  . CYSTOSCOPY     Dr Risa Grill  . SURGERY ON TESTICLE     at age 34    Family History  Problem Relation Age of Onset  . Heart disease Father     MI  . Hyperlipidemia Sister   . Coronary artery disease Paternal Uncle   . Colon cancer Other     GF  . Colon polyps Mother     also gallstones  . Diabetes Neg Hx   . Prostate cancer Father     dx ~ 15    No Known Allergies  Current Outpatient Prescriptions on File Prior to Visit  Medication Sig Dispense Refill  . cephALEXin (  KEFLEX) 500 MG capsule Take 1 capsule (500 mg total) by mouth 3 (three) times daily. 21 capsule 0   No current facility-administered medications on file prior to visit.     BP 129/88   Pulse (!) 58   Temp 98.5 F (36.9 C) (Oral)   Ht 6\' 4"  (1.93 m)   Wt 165 lb (74.8 kg)   SpO2 100%   BMI 20.08 kg/m       Objective:   Physical Exam  General Mental Status- Alert. General Appearance- Not in acute distress.   Skin General: Color- Normal Color. Moisture- Normal Moisture.  Neck Carotid Arteries- Normal color. Moisture- Normal Moisture. No carotid bruits. No JVD.  Chest and Lung Exam Auscultation: Breath Sounds:-Normal.  Cardiovascular Auscultation:Rythm- Regular. Murmurs & Other Heart Sounds:Auscultation of the heart reveals- No Murmurs.  Abdomen Inspection:-Inspeection Normal. Palpation/Percussion:Note:No mass. Palpation and Percussion of the  abdomen reveal- Non Tender, Non Distended + BS, no rebound or guarding.   Neurologic Cranial Nerve exam:- CN III-XII intact(No nystagmus), symmetric smile. Drift Test:- No drift. Romberg Exam:- Negative.  Heal to Toe Gait exam:-Normal. Finger to Nose:- Normal/Intact Strength:- 5/5 equal and symmetric strength both upper and lower extremities.     Assessment & Plan:  Ekg today did show hr at 52. When I walked with him down hall hr went to 64 then came back down to low 52-54 at rest.  For your various symptom will get sed rate, cmp, tsh, cbc, tsh, and tick bite studies.  For your faint mild ha will advise low dose ibuprofen today.   I want to hydrate with propel today and tomorrow.  We got ekg today.   Will follow up and see how your area doing when labs back. If negative may expand work up to include possible imaging studies.  If any pass out episode/syncope then ED evaluation.  Follow up 48 hours by phone or in office. You are heading out of town so I do want to talk with you and see how you are.  Note I asked him to continue to check bp and pulse. If pulses are low might refer to cardiologist for potential sick sinus syndrome  If he keeps having slight light headed transient symptoms would entertain idea of absense seizures.  If sed rate elevate then initiate prednisone and refer for temporal artery biopsy. I had asked MA Santiago Glad to do vision check.   Shareeka Yim, Percell Miller, PA-C

## 2016-06-15 NOTE — Progress Notes (Signed)
Pre visit review using our clinic tool,if applicable. No additional management support is needed unless otherwise documented below in the visit note.  

## 2016-06-15 NOTE — Patient Instructions (Signed)
For your various symptom will get sed rate, cmp, tsh, cbc, tsh, and tick bite studies.  For your faint mild ha will advise low dose ibuprofen today.   I want to hydrate with propel today and tomorrow.  We got ekg today.   Will follow up and see how your area doing when labs back. If negative may expand work up to include possible imaging studies.  If any pass out episode/syncope then ED evaluation.  Follow up 48 hours by phone or in office. You are heading out of town so I do want to talk with you and see how you are.

## 2016-06-16 LAB — COMPREHENSIVE METABOLIC PANEL
ALT: 18 U/L (ref 0–53)
AST: 19 U/L (ref 0–37)
Albumin: 4.6 g/dL (ref 3.5–5.2)
Alkaline Phosphatase: 83 U/L (ref 39–117)
BUN: 12 mg/dL (ref 6–23)
CO2: 26 meq/L (ref 19–32)
Calcium: 9.3 mg/dL (ref 8.4–10.5)
Chloride: 104 mEq/L (ref 96–112)
Creatinine, Ser: 1.03 mg/dL (ref 0.40–1.50)
GFR: 80.42 mL/min (ref >60.00)
GLUCOSE: 78 mg/dL (ref 70–99)
POTASSIUM: 4.2 meq/L (ref 3.5–5.1)
Sodium: 138 mEq/L (ref 135–145)
Total Bilirubin: 0.7 mg/dL (ref 0.2–1.2)
Total Protein: 7.5 g/dL (ref 6.0–8.3)

## 2016-06-16 LAB — CBC WITH DIFFERENTIAL/PLATELET
BASOS ABS: 0.2 10*3/uL — AB (ref 0.0–0.1)
Basophils Relative: 1.6 % (ref 0.0–3.0)
EOS PCT: 2.1 % (ref 0.0–5.0)
Eosinophils Absolute: 0.2 10*3/uL (ref 0.0–0.7)
HCT: 49 % (ref 39.0–52.0)
Hemoglobin: 16.4 g/dL (ref 13.0–17.0)
LYMPHS ABS: 3.5 10*3/uL (ref 0.7–4.0)
Lymphocytes Relative: 36 % (ref 12.0–46.0)
MCHC: 33.5 g/dL (ref 30.0–36.0)
MCV: 94 fl (ref 78.0–100.0)
MONO ABS: 0.8 10*3/uL (ref 0.1–1.0)
MONOS PCT: 8.3 % (ref 3.0–12.0)
NEUTROS ABS: 5.1 10*3/uL (ref 1.4–7.7)
NEUTROS PCT: 52 % (ref 43.0–77.0)
PLATELETS: 237 10*3/uL (ref 150.0–400.0)
RBC: 5.21 Mil/uL (ref 4.22–5.81)
RDW: 14.1 % (ref 11.5–15.5)
WBC: 9.7 10*3/uL (ref 4.0–10.5)

## 2016-06-16 LAB — LYME AB/WESTERN BLOT REFLEX

## 2016-06-16 LAB — TSH: TSH: 1.02 u[IU]/mL (ref 0.35–4.50)

## 2016-06-16 LAB — SEDIMENTATION RATE: SED RATE: 3 mm/h (ref 0–20)

## 2016-06-17 ENCOUNTER — Telehealth: Payer: Self-pay | Admitting: Medical

## 2016-06-17 LAB — ROCKY MTN SPOTTED FVR ABS PNL(IGG+IGM)
RMSF IGG: NOT DETECTED
RMSF IgM: NOT DETECTED

## 2016-06-17 NOTE — Telephone Encounter (Signed)
I tried to call pt after reviewing Stanley Glad LPN note. He was not available. Had to leave him message. After work hours now. Will try to call him later tonight. If not able to talk with him then asked he try to call us tomorrow 8am. He is about to head out of town on vacation. Wanted to touch base with him before vacation.

## 2016-06-18 ENCOUNTER — Telehealth: Payer: Self-pay | Admitting: Medical

## 2016-06-18 NOTE — Telephone Encounter (Signed)
Pt called today at 8:24. I was in a room. Have been busy all day. He is probably on vacation by now. Working up for dizzy, light headed/fatigue type complaint. Wanted to know how he was feeling. Remind him to keep self hydrated while on vacation/golfing. Follow up with me or Dr Larose Kells when back in town. If he has recurrent events while on vacation then UC or ED. I would recommend not drinking alcohol while on vacation.  Would you call pt and see how he is since today Thursday and have been busy all day. Thanks.

## 2016-06-18 NOTE — Telephone Encounter (Signed)
Called to follow up with patient.  He says he's feeling okay right now.  He's on his way to the airport.  States he will seek help if symptoms return.  He also says he will call back once he gets back in town to schedule a follow up appt.    He also says that he's scheduled for a colonoscopy on the 31st and says if Stanley Petty sees any reason why he shouldn't proceed with the procedure to let him know.

## 2016-06-19 LAB — LYME ABY, WSTRN BLT IGG & IGM W/BANDS
B BURGDORFERI IGG ABS (IB): NEGATIVE
B BURGDORFERI IGM ABS (IB): NEGATIVE
LYME DISEASE 23 KD IGG: NONREACTIVE
LYME DISEASE 23 KD IGM: NONREACTIVE
LYME DISEASE 39 KD IGG: NONREACTIVE
LYME DISEASE 41 KD IGG: NONREACTIVE
LYME DISEASE 66 KD IGG: NONREACTIVE
Lyme Disease 18 kD IgG: NONREACTIVE
Lyme Disease 28 kD IgG: NONREACTIVE
Lyme Disease 30 kD IgG: NONREACTIVE
Lyme Disease 39 kD IgM: NONREACTIVE
Lyme Disease 41 kD IgM: NONREACTIVE
Lyme Disease 45 kD IgG: NONREACTIVE
Lyme Disease 58 kD IgG: NONREACTIVE
Lyme Disease 93 kD IgG: NONREACTIVE

## 2016-06-22 ENCOUNTER — Telehealth: Payer: Self-pay | Admitting: Internal Medicine

## 2016-06-22 NOTE — Telephone Encounter (Signed)
°  Relationship to patient: Self Can be reached: [phone]   Reason for call: Request call back to discuss procedure that he has scheduled (Colonoscopy) for Thursday. Needs to know if he should do it.

## 2016-06-22 NOTE — Telephone Encounter (Signed)
Received medical release from [facility] requesting last OV notes, labs, and EKG faxed to [phone]. OV note from 02/20/2016, EKG from 06/15/2016 faxed.

## 2016-06-22 NOTE — Telephone Encounter (Signed)
Spoke w/ Pt, informed him that he would need to discuss w/ Dr. Liliane Channel office about proceeding with cscope. Informed him that he is due 10/2016 for his repeat. Pt verbalized understanding.

## 2016-06-25 ENCOUNTER — Encounter: Payer: Self-pay | Admitting: Internal Medicine

## 2016-09-22 ENCOUNTER — Encounter: Payer: Self-pay | Admitting: Internal Medicine

## 2016-09-22 ENCOUNTER — Ambulatory Visit (INDEPENDENT_AMBULATORY_CARE_PROVIDER_SITE_OTHER): Payer: BLUE CROSS/BLUE SHIELD | Admitting: Internal Medicine

## 2016-09-22 ENCOUNTER — Ambulatory Visit (HOSPITAL_BASED_OUTPATIENT_CLINIC_OR_DEPARTMENT_OTHER)
Admission: RE | Admit: 2016-09-22 | Discharge: 2016-09-22 | Disposition: A | Discharge status: Home / Self Care | Payer: BLUE CROSS/BLUE SHIELD | Source: Ambulatory Visit | Attending: Internal Medicine | Admitting: Internal Medicine

## 2016-09-22 VITALS — BP 112/76 | HR 65 | Temp 97.8°F | Resp 14 | Ht 76.0 in | Wt 166.5 lb

## 2016-09-22 DIAGNOSIS — M546 Pain in thoracic spine: Secondary | ICD-10-CM | POA: Insufficient documentation

## 2016-09-22 DIAGNOSIS — L918 Other hypertrophic disorders of the skin: Secondary | ICD-10-CM

## 2016-09-22 DIAGNOSIS — J069 Acute upper respiratory infection, unspecified: Secondary | ICD-10-CM | POA: Diagnosis not present

## 2016-09-22 NOTE — Patient Instructions (Signed)
Get your x-ray downstairs  Rest, take Tylenol and call if your back pain is not improving.

## 2016-09-22 NOTE — Progress Notes (Signed)
Pre visit review using our clinic review tool, if applicable. No additional management support is needed unless otherwise documented below in the visit note. 

## 2016-09-22 NOTE — Progress Notes (Signed)
Subjective:    Patient ID: Stanley Petty, male    DOB: [birth year], 50 y.o.   MRN: [id number]  DOS:  09/22/2016 Type of visit - description : Acute. Several concerns Interval history: Developed a URI 10 days ago with cough, nasal discharge, blowing clear discharge from the nose. At the time he did not have any fever or chills, took few Alka-Seltzer but not frequent NSAIDs. Overall symptoms are better now.  3 days ago developed ill-defined mid thoracic pain bilateral, on and off, no radiation, no recent injury. Feels like stretching may change how they pain feels but would not say if it make it feel better or worse.  Request a dermatology referral for skin lesion, left ear and a couple of other skin problems  Review of Systems Denies a rash. No difficulty breathing  Past Medical History:  Diagnosis Date  . IBS (irritable bowel syndrome)    Dr Earlean Shawl    Past Surgical History:  Procedure Laterality Date  . CYSTOSCOPY     Dr Risa Grill  . SURGERY ON TESTICLE     at age 31    Social History   Social History  . Marital status: Married    Spouse name: N/A  . Number of children: 2  . Years of education: N/A   Occupational History  . plumbing, has his own co    Social History Main Topics  . Smoking status: Current Every Day Smoker    Packs/day: 1.00    Types: Cigarettes  . Smokeless tobacco: Never Used     Comment: 1 ppd  . Alcohol use 0.0 oz/week     Comment: socially   . Drug use: No  . Sexual activity: Not on file   Other Topics Concern  . Not on file   Social History Narrative  . No narrative on file        Medication List       Accurate as of 09/22/16 11:59 PM. Always use your most recent med list.          cephALEXin 500 MG capsule Commonly known as:  KEFLEX Take 1 capsule (500 mg total) by mouth 3 (three) times daily.          Objective:   Physical Exam  Musculoskeletal:       Arms:  BP 112/76 (BP Location: Left Arm, Patient Position:  Sitting, Cuff Size: Normal)   Pulse 65   Temp 97.8 F (36.6 C) (Oral)   Resp 14   Ht 6\' 4"  (1.93 m)   Wt 166 lb 8 oz (75.5 kg)   SpO2 97%   BMI 20.27 kg/m  General:   Well developed, well nourished . NAD.  HEENT:  Normocephalic . Face symmetric, atraumatic.  Scaly, chronic-looking, 4 mm area noted on the left ear. TMs: Slightly bulge but not red. Nose is slightly congested, sinuses no TTP. Throat symmetric, no red Lungs:  CTA B Normal respiratory effort, no intercostal retractions, no accessory muscle use. Heart: RRR,  no murmur.  no pretibial edema bilaterally  Abdomen:  Not distended, soft, non-tender. No rebound or rigidity.  MSK: No TTP at the thoracic spine Skin: Not pale. Not jaundice Neurologic:  alert & oriented X3.  Speech normal, gait appropriate for age and unassisted Psych--  Cognition and judgment appear intact.  Cooperative with normal attention span and concentration.  Behavior appropriate. No anxious or depressed appearing.    Assessment & Plan:    Assessment IBS, Dr. Cher Nakai Hemorrhoids,  h/o banding   PLAN URI: Recent respiratory sx getting better, exam benign. Recommend observation Thoracic pain: Likely MSK, sprain from coughing due to recent URI? Patient is quite concerned about it, likes to be sure his not something lungs related, will get a chest x-ray Skin lesion, left ear, has other  lesions as well. Referred to dermatology

## 2016-09-24 NOTE — Assessment & Plan Note (Signed)
URI: Recent respiratory sx getting better, exam benign. Recommend observation Thoracic pain: Likely MSK, sprain from coughing due to recent URI? Patient is quite concerned about it, likes to be sure his not something lungs related, will get a chest x-ray Skin lesion, left ear, has other  lesions as well. Referred to dermatology

## 2016-12-15 ENCOUNTER — Encounter: Payer: Self-pay | Admitting: Internal Medicine

## 2016-12-15 ENCOUNTER — Ambulatory Visit (INDEPENDENT_AMBULATORY_CARE_PROVIDER_SITE_OTHER): Payer: BLUE CROSS/BLUE SHIELD | Admitting: Internal Medicine

## 2016-12-15 ENCOUNTER — Ambulatory Visit (HOSPITAL_BASED_OUTPATIENT_CLINIC_OR_DEPARTMENT_OTHER)
Admission: RE | Admit: 2016-12-15 | Discharge: 2016-12-15 | Disposition: A | Discharge status: Home / Self Care | Payer: BLUE CROSS/BLUE SHIELD | Source: Ambulatory Visit | Attending: Internal Medicine | Admitting: Internal Medicine

## 2016-12-15 VITALS — BP 124/76 | HR 74 | Temp 98.1°F | Resp 14 | Ht 76.0 in | Wt 167.2 lb

## 2016-12-15 DIAGNOSIS — R948 Abnormal results of function studies of other organs and systems: Secondary | ICD-10-CM | POA: Diagnosis present

## 2016-12-15 DIAGNOSIS — K769 Liver disease, unspecified: Secondary | ICD-10-CM | POA: Insufficient documentation

## 2016-12-15 DIAGNOSIS — R1011 Right upper quadrant pain: Secondary | ICD-10-CM | POA: Insufficient documentation

## 2016-12-15 LAB — URINALYSIS, ROUTINE W REFLEX MICROSCOPIC
BILIRUBIN URINE: NEGATIVE
Hgb urine dipstick: NEGATIVE
KETONES UR: NEGATIVE
Leukocytes, UA: NEGATIVE
NITRITE: NEGATIVE
RBC / HPF: NONE SEEN (ref 0–?)
SPECIFIC GRAVITY, URINE: 1.02 (ref 1.000–1.030)
Total Protein, Urine: NEGATIVE
URINE GLUCOSE: NEGATIVE
Urobilinogen, UA: 0.2 (ref 0.0–1.0)
pH: 6 (ref 5.0–8.0)

## 2016-12-15 LAB — CBC WITH DIFFERENTIAL/PLATELET
BASOS ABS: 0.1 10*3/uL (ref 0.0–0.1)
Basophils Relative: 1.1 % (ref 0.0–3.0)
EOS ABS: 0.1 10*3/uL (ref 0.0–0.7)
Eosinophils Relative: 1.3 % (ref 0.0–5.0)
HEMATOCRIT: 48 % (ref 39.0–52.0)
HEMOGLOBIN: 16.1 g/dL (ref 13.0–17.0)
LYMPHS PCT: 30.7 % (ref 12.0–46.0)
Lymphs Abs: 2.7 10*3/uL (ref 0.7–4.0)
MCHC: 33.6 g/dL (ref 30.0–36.0)
MCV: 93.8 fl (ref 78.0–100.0)
MONO ABS: 0.6 10*3/uL (ref 0.1–1.0)
Monocytes Relative: 7.3 % (ref 3.0–12.0)
Neutro Abs: 5.2 10*3/uL (ref 1.4–7.7)
Neutrophils Relative %: 59.6 % (ref 43.0–77.0)
Platelets: 253 10*3/uL (ref 150.0–400.0)
RBC: 5.12 Mil/uL (ref 4.22–5.81)
RDW: 13.7 % (ref 11.5–15.5)
WBC: 8.7 10*3/uL (ref 4.0–10.5)

## 2016-12-15 LAB — COMPREHENSIVE METABOLIC PANEL
ALBUMIN: 4.4 g/dL (ref 3.5–5.2)
ALK PHOS: 73 U/L (ref 39–117)
ALT: 22 U/L (ref 0–53)
AST: 19 U/L (ref 0–37)
BUN: 14 mg/dL (ref 6–23)
CALCIUM: 9.2 mg/dL (ref 8.4–10.5)
CO2: 26 mEq/L (ref 19–32)
CREATININE: 0.97 mg/dL (ref 0.40–1.50)
Chloride: 107 mEq/L (ref 96–112)
GFR: 86.03 mL/min (ref >60.00)
Glucose, Bld: 92 mg/dL (ref 70–99)
Potassium: 4.3 mEq/L (ref 3.5–5.1)
SODIUM: 137 meq/L (ref 135–145)
TOTAL PROTEIN: 7 g/dL (ref 6.0–8.3)
Total Bilirubin: 0.7 mg/dL (ref 0.2–1.2)

## 2016-12-15 LAB — LIPASE: Lipase: 9 U/L — ABNORMAL LOW (ref 11.0–59.0)

## 2016-12-15 LAB — AMYLASE: AMYLASE: 46 U/L (ref 27–131)

## 2016-12-15 MED ORDER — DICYCLOMINE HCL 20 MG PO TABS: 20.0000 mg | ORAL_TABLET | Freq: Four times a day (QID) | ORAL | 0 refills | Status: DC | PRN

## 2016-12-15 MED ORDER — PANTOPRAZOLE SODIUM 40 MG PO TBEC: 40.0000 mg | DELAYED_RELEASE_TABLET | Freq: Every day | ORAL | 0 refills | Status: DC

## 2016-12-15 NOTE — Progress Notes (Signed)
Subjective:    Patient ID: Stanley Petty, male    DOB: 1966/10/03, 51 y.o.   MRN: [id number]  DOS:  12/15/2016 Type of visit - description : rov Interval history: Symptoms started 5 days ago: On and off right-sided abdominal pain, feeling bloated, symptoms usually postprandial. Some discomfort radiates upward. Yesterday, had a worse episode, he had a bowel movement and after that symptoms decrease. Currently with mild soreness there (RUQ).  Few years ago, had similar symptoms, ultrasound showed no stones but a HIDA scan was abnormal. Also he had a couple of liver lesions. Since then, he had only sporadic symptoms of right upper quadrant discomfort, nothing like the last 5 days.    Review of Systems No fever chills Some nausea no vomiting. No blood in the stools. No constipation. No dysuria or gross hematuria No cough or difficulty breathing He does have mild heartburn.   Past Medical History:  Diagnosis Date  . IBS (irritable bowel syndrome)    Dr Earlean Shawl    Past Surgical History:  Procedure Laterality Date  . CYSTOSCOPY     Dr Risa Grill  . SURGERY ON TESTICLE     at age 38    Social History   Social History  . Marital status: Married    Spouse name: N/A  . Number of children: 2  . Years of education: N/A   Occupational History  . plumbing, has his own co    Social History Main Topics  . Smoking status: Current Every Day Smoker    Packs/day: 1.00    Types: Cigarettes  . Smokeless tobacco: Never Used     Comment: 1 ppd  . Alcohol use 0.0 oz/week     Comment: socially   . Drug use: No  . Sexual activity: Not on file   Other Topics Concern  . Not on file   Social History Narrative  . No narrative on file      Allergies as of 12/15/2016   No Known Allergies     Medication List       Accurate as of 12/15/16 11:59 PM. Always use your most recent med list.          dicyclomine 20 MG tablet Commonly known as:  BENTYL Take 1 tablet (20 mg total)  by mouth every 6 (six) hours as needed for spasms (stomach pain).   pantoprazole 40 MG tablet Commonly known as:  PROTONIX Take 1 tablet (40 mg total) by mouth daily.          Objective:   Physical Exam BP 124/76 (BP Location: Left Arm, Patient Position: Sitting, Cuff Size: Normal)   Pulse 74   Temp 98.1 F (36.7 C) (Oral)   Resp 14   Ht 6\' 4"  (1.93 m)   Wt 167 lb 4 oz (75.9 kg)   SpO2 98%   BMI 20.36 kg/m  General:   Well developed, well nourished . NAD.  HEENT:  Normocephalic . Face symmetric, atraumatic Lungs:  CTA B Normal respiratory effort, no intercostal retractions, no accessory muscle use. Heart: RRR,  no murmur.  no pretibial edema bilaterally  Abdomen:  Not distended, soft, non-tender. No rebound or rigidity.  Skin: Not pale. Not jaundice Neurologic:  alert & oriented X3.  Speech normal, gait appropriate for age and unassisted Psych--  Cognition and judgment appear intact.  Cooperative with normal attention span and concentration.  Behavior appropriate. No anxious or depressed appearing.    Assessment & Plan:  Assessment IBS, Dr. Cher Nakai Hemorrhoids, h/o banding Exercise tolerance test normal 10-2014 2012: Abdominal pain, [place] 2 liver lesions; HIDA: decreased EF    PLAN RUQ abdominal pain: 5 days history of right upper quadrant postprandial pain, suspect gallbladder issue. He does have some heartburn but suspect that is not the main driver of the pain. H/o abnormal HIDA and 2 liver lesions. Plan: CMP, CBC, amylase, lipase Light diet, avoid fatty or large meals. Check a ultrasound mostly to follow-up on liver lesions, refer to surgery. Pantoprazole daily, Bentyl as needed ER if symptoms severe. Wonders if he needs an EGD as suggested to him, I recommend to first d/w general surgery to  see if that is needed.

## 2016-12-15 NOTE — Progress Notes (Signed)
Pre visit review using our clinic review tool, if applicable. No additional management support is needed unless otherwise documented below in the visit note. 

## 2016-12-15 NOTE — Patient Instructions (Signed)
GO TO THE LAB : Get the blood work     We are scheduling the ultrasound of your abdomen and referral to see a surgeon  Try to eat very light  Start a medication called pantoprazole, and acid reducer, every morning before breakfast  For stomach pain, try Bentyl every 6 hours as needed only.  Go to the ER if severe symptoms, fever, chills, blood in the stools, severe stomach pain.

## 2016-12-16 ENCOUNTER — Telehealth: Payer: Self-pay | Admitting: Internal Medicine

## 2016-12-16 ENCOUNTER — Ambulatory Visit (HOSPITAL_BASED_OUTPATIENT_CLINIC_OR_DEPARTMENT_OTHER): Payer: BLUE CROSS/BLUE SHIELD

## 2016-12-16 LAB — URINE CULTURE: Organism ID, Bacteria: NO GROWTH

## 2016-12-16 NOTE — Telephone Encounter (Signed)
Please advise 

## 2016-12-16 NOTE — Telephone Encounter (Signed)
Advise patient:  blood work and urine test negative Ultrasound showed with again 3 spots in the liver, after he sees surgery needs a follow-up here, will consider to MRI. Encouraged to share these results with surgery as well.

## 2016-12-16 NOTE — Assessment & Plan Note (Signed)
RUQ abdominal pain: 5 days history of right upper quadrant postprandial pain, suspect gallbladder issue. He does have some heartburn but suspect that is not the main driver of the pain. H/o abnormal HIDA and 2 liver lesions. Plan: CMP, CBC, amylase, lipase Light diet, avoid fatty or large meals. Check a ultrasound mostly to follow-up on liver lesions, refer to surgery. Pantoprazole daily, Bentyl as needed ER if symptoms severe. Wonders if he needs an EGD as suggested to him, I recommend to first d/w general surgery to  see if that is needed.

## 2016-12-16 NOTE — Telephone Encounter (Signed)
Relation to [id number] Call back number:[phone]   Reason for call:  Patient inquiring about imaging and lab results

## 2016-12-16 NOTE — Telephone Encounter (Signed)
Spoke w/ Pt, informed him of results and recommendations. Pt requesting reports and lab results be printed and placed at front desk for pick up, informed Pt will do.

## 2016-12-17 ENCOUNTER — Telehealth: Payer: Self-pay | Admitting: Internal Medicine

## 2016-12-17 NOTE — Telephone Encounter (Signed)
Patient states that he just want to know if he can take something OTC for diarrhea

## 2016-12-17 NOTE — Telephone Encounter (Signed)
Spoke w/ Pt for > 10 mins yesterday, if questions per PCP schedule an appt.

## 2016-12-17 NOTE — Telephone Encounter (Signed)
Caller name: Relationship to patient: Self Can be reached: [phone]  Pharmacy:  Reason for call: Patient request to have a call back to discuss gallbladder problem.

## 2016-12-20 NOTE — Telephone Encounter (Signed)
[place]

## 2016-12-21 NOTE — Telephone Encounter (Signed)
LMOM informing Pt of recommendations. Informed if he uses OTC Pepto Bismol it can turn his stools a dark color. Informed if sx's not improving to let us know.

## 2017-01-12 ENCOUNTER — Ambulatory Visit: Payer: Self-pay | Admitting: General Surgery

## 2017-01-12 NOTE — Progress Notes (Signed)
12-15-16  (EPIC)       CBC with differential, CMP 09-12-16  (EPIC)     CXR 06-15-16 (EPIC)         EKG

## 2017-01-12 NOTE — Patient Instructions (Addendum)
Stanley Petty  01/12/2017   Your procedure is scheduled on:  Friday 01-15-17  Report to [facility] Main  Entrance take [facility]  elevators to 3rd floor to  [place] at 1145 AM.  Call this number if you have problems the morning of surgery [phone]   Remember: ONLY 1 PERSON MAY GO WITH YOU TO SHORT STAY TO GET  READY MORNING OF [place].   Do not eat food after Midnight. You may have clear liquids from midnight up until 0745. Nothing after 0745AM!   CLEAR LIQUID DIET   Foods Allowed                                                                     Foods Excluded  Coffee and tea, regular and decaf                             liquids that you cannot  Plain Jell-O in any flavor                                             see through such as: Fruit ices (not with fruit pulp)                                     milk, soups, orange juice  Iced Popsicles                                    All solid food Carbonated beverages, regular and diet                                    Cranberry, grape and apple juices Sports drinks like Gatorade Lightly seasoned clear broth or consume(fat free) Sugar, honey syrup  Sample Menu Breakfast                                Lunch                                     Supper Cranberry juice                    Beef broth                            Chicken broth Jell-O                                     Grape juice  Apple juice Coffee or tea                        Jell-O                                      Popsicle                                                Coffee or tea                        Coffee or tea  _____________________________________________________________________       Take these medicines the morning of surgery with A SIP OF WATER:  None                                 You may not have any metal on your body including hair pins and              piercings  Do not wear  jewelry, make-up, lotions, powders or perfumes, deodorant             Do not wear nail polish.  Do not shave  48 hours prior to surgery.              Men may shave face and neck.   Do not bring valuables to the hospital. [place].  Contacts, dentures or bridgework may not be worn into surgery.  Leave suitcase in the car. After surgery it may be brought to your room.     Patients discharged the day of surgery will not be allowed to drive home.  Name and phone number of your driver:  Special Instructions: N/A              Please read over the following fact sheets you were given: _____________________________________________________________________  [facility] - Preparing for Surgery Before surgery, you can play an important role.  Because skin is not sterile, your skin needs to be as free of germs as possible.  You can reduce the number of germs on your skin by washing with CHG (chlorahexidine gluconate) soap before surgery.  CHG is an antiseptic cleaner which kills germs and bonds with the skin to continue killing germs even after washing. Please DO NOT use if you have an allergy to CHG or antibacterial soaps.  If your skin becomes reddened/irritated stop using the CHG and inform your nurse when you arrive at Short Stay. Do not shave (including legs and underarms) for at least 48 hours prior to the first CHG shower.  You may shave your face/neck. Please follow these instructions carefully:  1.  Shower with CHG Soap the night before surgery and the  morning of Surgery.  2.  If you choose to wash your hair, wash your hair first as usual with your  normal  shampoo.  3.  After you shampoo, rinse your hair and body thoroughly to remove the  shampoo.  4.  Use CHG as you would any other liquid soap.  You can apply chg directly  to the skin and wash                       Gently with a scrungie or clean washcloth.  5.  Apply  the CHG Soap to your body ONLY FROM THE NECK DOWN.   Do not use on face/ open                           Wound or open sores. Avoid contact with eyes, ears mouth and genitals (private parts).                       Wash face,  Genitals (private parts) with your normal soap.             6.  Wash thoroughly, paying special attention to the area where your surgery  will be performed.  7.  Thoroughly rinse your body with warm water from the neck down.  8.  DO NOT shower/wash with your normal soap after using and rinsing off  the CHG Soap.                9.  Pat yourself dry with a clean towel.            10.  Wear clean pajamas.            11.  Place clean sheets on your bed the night of your first shower and do not  sleep with pets. Day of Surgery : Do not apply any lotions/deodorants the morning of surgery.  Please wear clean clothes to the hospital/surgery center.  FAILURE TO FOLLOW THESE INSTRUCTIONS MAY RESULT IN THE CANCELLATION OF YOUR SURGERY PATIENT SIGNATURE_________________________________  NURSE SIGNATURE__________________________________  ________________________________________________________________________

## 2017-01-12 NOTE — H&P (Signed)
History of Present Illness Ralene Ok MD; 12/18/2016 11:26 AM) The patient is a 51 year old male who presents for evaluation of gall stones. The patient is a 51 year old male who is referred by Dr. Larose Kells for evaluation of biliary dyskinesia. Patient's had a history of right upper quadrant pain. He states this was associated nausea but no emesis. Patient had a previous HIDA scan which reveals a 17% ejection fraction. Patient ultrasound revealed no stones and no signs of cholecystitis.   Past Surgical History Malachy Moan, [place]; 12/18/2016 10:41 AM) Colon Polyp Removal - Colonoscopy   Diagnostic Studies History Malachy Moan, RMA; 12/18/2016 10:41 AM) Colonoscopy  within last year  Allergies Malachy Moan, RMA; 12/18/2016 10:42 AM) No Known Allergies 12/18/2016  Medication History Malachy Moan, RMA; 12/18/2016 10:43 AM) Pantoprazole Sodium (40MG  Tablet DR, Oral) Active. Bentyl (Oral) Specific strength unknown - Active. Medications Reconciled  Social History Malachy Moan, [place]; 12/18/2016 10:41 AM) Alcohol use  Occasional alcohol use. Caffeine use  Carbonated beverages, Coffee, Tea. Tobacco use  Current every day smoker.  Family History Malachy Moan, [place]; 12/18/2016 10:41 AM) Colon Cancer  Family Members In General. Colon Polyps  Mother. Prostate Cancer  Father.  Other Problems Malachy Moan, [place]; 12/18/2016 10:41 AM) Hemorrhoids     Review of Systems Malachy Moan RMA; 12/18/2016 10:41 AM) General Present- Fatigue. Not Present- Appetite Loss, Chills, Fever, Night Sweats, Weight Gain and Weight Loss. Skin Not Present- Change in Wart/Mole, Dryness, Hives, Jaundice, New Lesions, Non-Healing Wounds, Rash and Ulcer. HEENT Not Present- Earache, Hearing Loss, Hoarseness, Nose Bleed, Oral Ulcers, Ringing in the Ears, Seasonal Allergies, Sinus Pain, Sore Throat, Visual Disturbances, Wears glasses/contact lenses and Yellow Eyes. Respiratory Not  Present- Bloody sputum, Chronic Cough, Difficulty Breathing, Snoring and Wheezing. Breast Not Present- Breast Mass, Breast Pain, Nipple Discharge and Skin Changes. Cardiovascular Present- Leg Cramps and Shortness of Breath. Not Present- Chest Pain, Difficulty Breathing Lying Down, Palpitations, Rapid Heart Rate and Swelling of Extremities. Gastrointestinal Present- Abdominal Pain, Chronic diarrhea and Indigestion. Not Present- Bloating, Bloody Stool, Change in Bowel Habits, Constipation, Difficulty Swallowing, Excessive gas, Gets full quickly at meals, Hemorrhoids, Nausea, Rectal Pain and Vomiting. Male Genitourinary Not Present- Blood in Urine, Change in Urinary Stream, Frequency, Impotence, Nocturia, Painful Urination, Urgency and Urine Leakage. Musculoskeletal Not Present- Back Pain, Joint Pain, Joint Stiffness, Muscle Pain, Muscle Weakness and Swelling of Extremities. Neurological Not Present- Decreased Memory, Fainting, Headaches, Numbness, Seizures, Tingling, Tremor, Trouble walking and Weakness. Psychiatric Not Present- Anxiety, Bipolar, Change in Sleep Pattern, Depression, Fearful and Frequent crying. Endocrine Not Present- Cold Intolerance, Excessive Hunger, Hair Changes, Heat Intolerance, Hot flashes and New Diabetes. Hematology Not Present- Blood Thinners, Easy Bruising, Excessive bleeding, Gland problems, HIV and Persistent Infections.  Vitals Malachy Moan RMA; 12/18/2016 10:43 AM) 12/18/2016 10:43 AM Weight: 163.6 lb Height: 76in Body Surface Area: 2.03 m Body Mass Index: 19.91 kg/m  Temp.: 98.72F  Pulse: 71 (Regular)  BP: 130/92 (Sitting, Left Arm, Standard)       Physical Exam Ralene Ok, MD; 12/18/2016 11:27 AM) General Mental Status-Alert. General Appearance-Consistent with stated age. Hydration-Well hydrated. Voice-Normal.  Head and Neck Head-normocephalic, atraumatic with no lesions or palpable masses.  Eye Eyeball -  Bilateral-Extraocular movements intact. Sclera/Conjunctiva - Bilateral-No scleral icterus.  Chest and Lung Exam Chest and lung exam reveals -quiet, even and easy respiratory effort with no use of accessory muscles. Inspection Chest Wall - Normal. Back - normal.  Cardiovascular Cardiovascular examination reveals -normal heart sounds, regular rate and rhythm with  no murmurs.  Abdomen Inspection Normal Exam - No Hernias. Palpation/Percussion Normal exam - Soft, Non Tender, No Rebound tenderness, No Rigidity (guarding) and No hepatosplenomegaly. Auscultation Normal exam - Bowel sounds normal.  Neurologic Neurologic evaluation reveals -alert and oriented x 3 with no impairment of recent or remote memory. Mental Status-Normal.  Musculoskeletal Normal Exam - Left-Upper Extremity Strength Normal and Lower Extremity Strength Normal. Normal Exam - Right-Upper Extremity Strength Normal, Lower Extremity Weakness.    Assessment & Plan Ralene Ok MD; 12/18/2016 11:26 AM) BILIARY DYSKINESIA (K82.8) Impression: 51 year old male with biliary dyskinesia.  1. We will proceed to the operating room for a laparoscopic cholecystectomy  2. Risks and benefits were discussed with the patient to generally include, but not limited to: infection, bleeding, possible need for post op ERCP, damage to the bile ducts, bile leak, and possible need for further surgery. Alternatives were offered and described. All questions were answered and the patient voiced understanding of the procedure and wishes to proceed at this point with a laparoscopic cholecystectomy

## 2017-01-13 ENCOUNTER — Encounter (HOSPITAL_COMMUNITY)
Admission: RE | Admit: 2017-01-13 | Discharge: 2017-01-13 | Disposition: A | Discharge status: Home / Self Care | Payer: BLUE CROSS/BLUE SHIELD | Source: Ambulatory Visit | Attending: General Surgery | Admitting: General Surgery

## 2017-01-13 ENCOUNTER — Encounter (HOSPITAL_COMMUNITY): Payer: Self-pay

## 2017-01-13 DIAGNOSIS — Z8489 Family history of other specified conditions: Secondary | ICD-10-CM | POA: Diagnosis not present

## 2017-01-13 DIAGNOSIS — K660 Peritoneal adhesions (postprocedural) (postinfection): Secondary | ICD-10-CM | POA: Diagnosis not present

## 2017-01-13 DIAGNOSIS — Z8042 Family history of malignant neoplasm of prostate: Secondary | ICD-10-CM | POA: Diagnosis not present

## 2017-01-13 DIAGNOSIS — K811 Chronic cholecystitis: Secondary | ICD-10-CM | POA: Diagnosis present

## 2017-01-13 DIAGNOSIS — F17200 Nicotine dependence, unspecified, uncomplicated: Secondary | ICD-10-CM | POA: Diagnosis not present

## 2017-01-13 DIAGNOSIS — Z800 Family history of malignant neoplasm of digestive organs: Secondary | ICD-10-CM | POA: Diagnosis not present

## 2017-01-13 DIAGNOSIS — Z8371 Family history of colonic polyps: Secondary | ICD-10-CM | POA: Diagnosis not present

## 2017-01-13 DIAGNOSIS — K828 Other specified diseases of gallbladder: Secondary | ICD-10-CM | POA: Diagnosis not present

## 2017-01-13 HISTORY — DX: Gastro-esophageal reflux disease without esophagitis: K21.9

## 2017-01-13 LAB — CBC
HEMATOCRIT: 46 % (ref 39.0–52.0)
Hemoglobin: 16 g/dL (ref 13.0–17.0)
MCH: 31.3 pg (ref 26.0–34.0)
MCHC: 34.8 g/dL (ref 30.0–36.0)
MCV: 89.8 fL (ref 78.0–100.0)
Platelets: 306 10*3/uL (ref 150–400)
RBC: 5.12 MIL/uL (ref 4.22–5.81)
RDW: 13 % (ref 11.5–15.5)
WBC: 9.5 10*3/uL (ref 4.0–10.5)

## 2017-01-13 LAB — BASIC METABOLIC PANEL
Anion gap: 8 (ref 5–15)
BUN: 17 mg/dL (ref 6–20)
CALCIUM: 9.2 mg/dL (ref 8.9–10.3)
CO2: 24 mmol/L (ref 22–32)
CREATININE: 0.94 mg/dL (ref 0.61–1.24)
Chloride: 105 mmol/L (ref 101–111)
GFR calc non Af Amer: >60 mL/min (ref >60)
Glucose, Bld: 85 mg/dL (ref 65–99)
Potassium: 4.5 mmol/L (ref 3.5–5.1)
SODIUM: 137 mmol/L (ref 135–145)

## 2017-01-14 ENCOUNTER — Ambulatory Visit: Payer: Self-pay | Admitting: General Surgery

## 2017-01-14 NOTE — H&P (Signed)
History of Present Illness Ralene Ok MD; 12/18/2016 11:26 AM) The patient is a 51 year old male who presents for evaluation of gall stones. The patient is a 51 year old male who is referred by Dr. Larose Kells for evaluation of biliary dyskinesia. Patient's had a history of right upper quadrant pain. He states this was associated nausea but no emesis. Patient had a previous HIDA scan which reveals a 17% ejection fraction. Patient ultrasound revealed no stones and no signs of cholecystitis.   Past Surgical History Malachy Moan, [place]; 12/18/2016 10:41 AM) Colon Polyp Removal - Colonoscopy   Diagnostic Studies History Malachy Moan, RMA; 12/18/2016 10:41 AM) Colonoscopy  within last year  Allergies Malachy Moan, RMA; 12/18/2016 10:42 AM) No Known Allergies 12/18/2016  Medication History Malachy Moan, RMA; 12/18/2016 10:43 AM) Pantoprazole Sodium (40MG  Tablet DR, Oral) Active. Bentyl (Oral) Specific strength unknown - Active. Medications Reconciled  Social History Malachy Moan, [place]; 12/18/2016 10:41 AM) Alcohol use  Occasional alcohol use. Caffeine use  Carbonated beverages, Coffee, Tea. Tobacco use  Current every day smoker.  Family History Malachy Moan, [place]; 12/18/2016 10:41 AM) Colon Cancer  Family Members In General. Colon Polyps  Mother. Prostate Cancer  Father.  Other Problems Malachy Moan, [place]; 12/18/2016 10:41 AM) Hemorrhoids     Review of Systems Malachy Moan RMA; 12/18/2016 10:41 AM) General Present- Fatigue. Not Present- Appetite Loss, Chills, Fever, Night Sweats, Weight Gain and Weight Loss. Skin Not Present- Change in Wart/Mole, Dryness, Hives, Jaundice, New Lesions, Non-Healing Wounds, Rash and Ulcer. HEENT Not Present- Earache, Hearing Loss, Hoarseness, Nose Bleed, Oral Ulcers, Ringing in the Ears, Seasonal Allergies, Sinus Pain, Sore Throat, Visual Disturbances, Wears glasses/contact lenses and Yellow Eyes. Respiratory Not  Present- Bloody sputum, Chronic Cough, Difficulty Breathing, Snoring and Wheezing. Breast Not Present- Breast Mass, Breast Pain, Nipple Discharge and Skin Changes. Cardiovascular Present- Leg Cramps and Shortness of Breath. Not Present- Chest Pain, Difficulty Breathing Lying Down, Palpitations, Rapid Heart Rate and Swelling of Extremities. Gastrointestinal Present- Abdominal Pain, Chronic diarrhea and Indigestion. Not Present- Bloating, Bloody Stool, Change in Bowel Habits, Constipation, Difficulty Swallowing, Excessive gas, Gets full quickly at meals, Hemorrhoids, Nausea, Rectal Pain and Vomiting. Male Genitourinary Not Present- Blood in Urine, Change in Urinary Stream, Frequency, Impotence, Nocturia, Painful Urination, Urgency and Urine Leakage. Musculoskeletal Not Present- Back Pain, Joint Pain, Joint Stiffness, Muscle Pain, Muscle Weakness and Swelling of Extremities. Neurological Not Present- Decreased Memory, Fainting, Headaches, Numbness, Seizures, Tingling, Tremor, Trouble walking and Weakness. Psychiatric Not Present- Anxiety, Bipolar, Change in Sleep Pattern, Depression, Fearful and Frequent crying. Endocrine Not Present- Cold Intolerance, Excessive Hunger, Hair Changes, Heat Intolerance, Hot flashes and New Diabetes. Hematology Not Present- Blood Thinners, Easy Bruising, Excessive bleeding, Gland problems, HIV and Persistent Infections.  Vitals Malachy Moan RMA; 12/18/2016 10:43 AM) 12/18/2016 10:43 AM Weight: 163.6 lb Height: 76in Body Surface Area: 2.03 m Body Mass Index: 19.91 kg/m  Temp.: 98.69F  Pulse: 71 (Regular)  BP: 130/92 (Sitting, Left Arm, Standard)       Physical Exam Ralene Ok, MD; 12/18/2016 11:27 AM) General Mental Status-Alert. General Appearance-Consistent with stated age. Hydration-Well hydrated. Voice-Normal.  Head and Neck Head-normocephalic, atraumatic with no lesions or palpable masses.  Eye Eyeball -  Bilateral-Extraocular movements intact. Sclera/Conjunctiva - Bilateral-No scleral icterus.  Chest and Lung Exam Chest and lung exam reveals -quiet, even and easy respiratory effort with no use of accessory muscles. Inspection Chest Wall - Normal. Back - normal.  Cardiovascular Cardiovascular examination reveals -normal heart sounds, regular rate and rhythm with  no murmurs.  Abdomen Inspection Normal Exam - No Hernias. Palpation/Percussion Normal exam - Soft, Non Tender, No Rebound tenderness, No Rigidity (guarding) and No hepatosplenomegaly. Auscultation Normal exam - Bowel sounds normal.  Neurologic Neurologic evaluation reveals -alert and oriented x 3 with no impairment of recent or remote memory. Mental Status-Normal.  Musculoskeletal Normal Exam - Left-Upper Extremity Strength Normal and Lower Extremity Strength Normal. Normal Exam - Right-Upper Extremity Strength Normal, Lower Extremity Weakness.    Assessment & Plan Ralene Ok MD; 12/18/2016 11:26 AM) BILIARY DYSKINESIA (K82.8) Impression: 51 year old male with biliary dyskinesia.  1. We will proceed to the operating room for a laparoscopic cholecystectomy  2. Risks and benefits were discussed with the patient to generally include, but not limited to: infection, bleeding, possible need for post op ERCP, damage to the bile ducts, bile leak, and possible need for further surgery. Alternatives were offered and described. All questions were answered and the patient voiced understanding of the procedure and wishes to proceed at this point with a laparoscopic cholecystectomy

## 2017-01-15 ENCOUNTER — Encounter (HOSPITAL_COMMUNITY)
Admission: RE | Disposition: A | Discharge status: Home / Self Care | Payer: Self-pay | Source: Ambulatory Visit | Attending: General Surgery

## 2017-01-15 ENCOUNTER — Encounter (HOSPITAL_COMMUNITY): Payer: Self-pay | Admitting: *Deleted

## 2017-01-15 ENCOUNTER — Ambulatory Visit (HOSPITAL_COMMUNITY): Payer: BLUE CROSS/BLUE SHIELD | Admitting: Anesthesiology

## 2017-01-15 ENCOUNTER — Ambulatory Visit (HOSPITAL_COMMUNITY)
Admission: RE | Admit: 2017-01-15 | Discharge: 2017-01-15 | Disposition: A | Discharge status: Home / Self Care | Payer: BLUE CROSS/BLUE SHIELD | Source: Ambulatory Visit | Attending: General Surgery | Admitting: General Surgery

## 2017-01-15 DIAGNOSIS — K811 Chronic cholecystitis: Secondary | ICD-10-CM | POA: Diagnosis not present

## 2017-01-15 DIAGNOSIS — K660 Peritoneal adhesions (postprocedural) (postinfection): Secondary | ICD-10-CM | POA: Insufficient documentation

## 2017-01-15 DIAGNOSIS — Z8489 Family history of other specified conditions: Secondary | ICD-10-CM | POA: Insufficient documentation

## 2017-01-15 DIAGNOSIS — Z8042 Family history of malignant neoplasm of prostate: Secondary | ICD-10-CM | POA: Insufficient documentation

## 2017-01-15 DIAGNOSIS — K828 Other specified diseases of gallbladder: Secondary | ICD-10-CM | POA: Insufficient documentation

## 2017-01-15 DIAGNOSIS — Z800 Family history of malignant neoplasm of digestive organs: Secondary | ICD-10-CM | POA: Insufficient documentation

## 2017-01-15 DIAGNOSIS — Z8371 Family history of colonic polyps: Secondary | ICD-10-CM | POA: Insufficient documentation

## 2017-01-15 DIAGNOSIS — F17200 Nicotine dependence, unspecified, uncomplicated: Secondary | ICD-10-CM | POA: Insufficient documentation

## 2017-01-15 HISTORY — PX: CHOLECYSTECTOMY: SHX55

## 2017-01-15 SURGERY — LAPAROSCOPIC CHOLECYSTECTOMY
Anesthesia: General

## 2017-01-15 MED ORDER — SODIUM CHLORIDE 0.9 % IV SOLN: 250.0000 mL | INTRAVENOUS | Status: DC | PRN

## 2017-01-15 MED ORDER — HYDROMORPHONE HCL 1 MG/ML IJ SOLN
INTRAMUSCULAR | Status: AC
  Filled 2017-01-15: qty 1

## 2017-01-15 MED ORDER — SODIUM CHLORIDE 0.9% FLUSH: 3.0000 mL | Freq: Two times a day (BID) | INTRAVENOUS | Status: DC

## 2017-01-15 MED ORDER — ACETAMINOPHEN 10 MG/ML IV SOLN
INTRAVENOUS | Status: AC
  Administered 2017-01-15: 1000 mg
  Filled 2017-01-15: qty 100

## 2017-01-15 MED ORDER — FENTANYL CITRATE (PF) 100 MCG/2ML IJ SOLN
INTRAMUSCULAR | Status: DC | PRN
  Administered 2017-01-15 (×4): 50 ug via INTRAVENOUS

## 2017-01-15 MED ORDER — MORPHINE SULFATE (PF) 4 MG/ML IV SOLN
2.0000 mg | INTRAVENOUS | Status: DC | PRN
Start: 2017-01-15 — End: 2017-01-17

## 2017-01-15 MED ORDER — KETOROLAC TROMETHAMINE 30 MG/ML IJ SOLN
INTRAMUSCULAR | Status: AC
  Administered 2017-01-15: 30 mg
  Filled 2017-01-15: qty 1

## 2017-01-15 MED ORDER — ONDANSETRON HCL 4 MG/2ML IJ SOLN
INTRAMUSCULAR | Status: DC | PRN
  Administered 2017-01-15: 4 mg via INTRAVENOUS

## 2017-01-15 MED ORDER — SUGAMMADEX SODIUM 200 MG/2ML IV SOLN
INTRAVENOUS | Status: AC
  Filled 2017-01-15: qty 2

## 2017-01-15 MED ORDER — OXYCODONE HCL 5 MG PO TABS
5.0000 mg | ORAL_TABLET | ORAL | Status: DC | PRN
  Administered 2017-01-15: 10 mg via ORAL
  Filled 2017-01-15: qty 2

## 2017-01-15 MED ORDER — BUPIVACAINE HCL (PF) 0.25 % IJ SOLN
INTRAMUSCULAR | Status: DC | PRN
  Administered 2017-01-15: 10 mL

## 2017-01-15 MED ORDER — ACETAMINOPHEN 650 MG RE SUPP
650.0000 mg | RECTAL | Status: DC | PRN
  Filled 2017-01-15: qty 1

## 2017-01-15 MED ORDER — HYDROMORPHONE HCL 1 MG/ML IJ SOLN
INTRAMUSCULAR | Status: AC
  Administered 2017-01-15: 0.5 mg via INTRAVENOUS
  Filled 2017-01-15: qty 0.5

## 2017-01-15 MED ORDER — SODIUM CHLORIDE 0.9 % IR SOLN
Status: DC | PRN
  Administered 2017-01-15: 1000 mL

## 2017-01-15 MED ORDER — CHLORHEXIDINE GLUCONATE CLOTH 2 % EX PADS: 6.0000 | MEDICATED_PAD | Freq: Once | CUTANEOUS | Status: DC

## 2017-01-15 MED ORDER — HYDROMORPHONE HCL 1 MG/ML IJ SOLN
0.2500 mg | INTRAMUSCULAR | Status: DC | PRN
  Administered 2017-01-15 (×2): 0.5 mg via INTRAVENOUS

## 2017-01-15 MED ORDER — BUPIVACAINE HCL (PF) 0.25 % IJ SOLN
INTRAMUSCULAR | Status: AC
  Filled 2017-01-15: qty 30

## 2017-01-15 MED ORDER — PROPOFOL 10 MG/ML IV BOLUS
INTRAVENOUS | Status: AC
  Filled 2017-01-15: qty 20

## 2017-01-15 MED ORDER — SCOPOLAMINE 1 MG/3DAYS TD PT72
1.0000 | MEDICATED_PATCH | TRANSDERMAL | Status: DC
  Administered 2017-01-15: 1.5 mg via TRANSDERMAL

## 2017-01-15 MED ORDER — PROMETHAZINE HCL 25 MG/ML IJ SOLN: 6.2500 mg | INTRAMUSCULAR | Status: DC | PRN

## 2017-01-15 MED ORDER — MIDAZOLAM HCL 5 MG/5ML IJ SOLN
INTRAMUSCULAR | Status: DC | PRN
  Administered 2017-01-15: 2 mg via INTRAVENOUS

## 2017-01-15 MED ORDER — CEFAZOLIN SODIUM-DEXTROSE 2-4 GM/100ML-% IV SOLN: 2.0000 g | INTRAVENOUS | Status: DC

## 2017-01-15 MED ORDER — ROCURONIUM BROMIDE 50 MG/5ML IV SOSY
PREFILLED_SYRINGE | INTRAVENOUS | Status: AC
  Filled 2017-01-15: qty 5

## 2017-01-15 MED ORDER — FENTANYL CITRATE (PF) 100 MCG/2ML IJ SOLN
INTRAMUSCULAR | Status: AC
  Filled 2017-01-15: qty 2

## 2017-01-15 MED ORDER — LACTATED RINGERS IV SOLN
INTRAVENOUS | Status: DC
  Administered 2017-01-15 (×2): via INTRAVENOUS

## 2017-01-15 MED ORDER — LIDOCAINE 2% (20 MG/ML) 5 ML SYRINGE
INTRAMUSCULAR | Status: DC | PRN
  Administered 2017-01-15: 100 mg via INTRAVENOUS

## 2017-01-15 MED ORDER — EPHEDRINE SULFATE-NACL 50-0.9 MG/10ML-% IV SOSY
PREFILLED_SYRINGE | INTRAVENOUS | Status: DC | PRN
  Administered 2017-01-15: 5 mg via INTRAVENOUS

## 2017-01-15 MED ORDER — MIDAZOLAM HCL 2 MG/2ML IJ SOLN
INTRAMUSCULAR | Status: AC
  Filled 2017-01-15: qty 2

## 2017-01-15 MED ORDER — SCOPOLAMINE 1 MG/3DAYS TD PT72
MEDICATED_PATCH | TRANSDERMAL | Status: AC
  Administered 2017-01-15: 1.5 mg via TRANSDERMAL
  Filled 2017-01-15: qty 1

## 2017-01-15 MED ORDER — PROPOFOL 10 MG/ML IV BOLUS
INTRAVENOUS | Status: DC | PRN
  Administered 2017-01-15: 80 mg via INTRAVENOUS

## 2017-01-15 MED ORDER — ONDANSETRON HCL 4 MG/2ML IJ SOLN
INTRAMUSCULAR | Status: AC
  Filled 2017-01-15: qty 2

## 2017-01-15 MED ORDER — SUGAMMADEX SODIUM 200 MG/2ML IV SOLN
INTRAVENOUS | Status: DC | PRN
  Administered 2017-01-15: 200 mg via INTRAVENOUS

## 2017-01-15 MED ORDER — SODIUM CHLORIDE 0.9% FLUSH: 3.0000 mL | INTRAVENOUS | Status: DC | PRN

## 2017-01-15 MED ORDER — ROCURONIUM BROMIDE 10 MG/ML (PF) SYRINGE
PREFILLED_SYRINGE | INTRAVENOUS | Status: DC | PRN
  Administered 2017-01-15: 50 mg via INTRAVENOUS

## 2017-01-15 MED ORDER — OXYCODONE HCL 5 MG PO TABS: 5.0000 mg | ORAL_TABLET | ORAL | 0 refills | Status: DC | PRN

## 2017-01-15 MED ORDER — CEFAZOLIN SODIUM-DEXTROSE 2-4 GM/100ML-% IV SOLN
2.0000 g | INTRAVENOUS | Status: AC
  Administered 2017-01-15: 2 g via INTRAVENOUS
  Filled 2017-01-15: qty 100

## 2017-01-15 MED ORDER — ACETAMINOPHEN 325 MG PO TABS: 650.0000 mg | ORAL_TABLET | ORAL | Status: DC | PRN

## 2017-01-15 SURGICAL SUPPLY — 39 items
APL SKNCLS STERI-STRIP NONHPOA (GAUZE/BANDAGES/DRESSINGS) ×1
APPLIER CLIP 5 13 M/L LIGAMAX5 (MISCELLANEOUS)
APR CLP MED LRG 5 ANG JAW (MISCELLANEOUS)
BAG SPEC RTRVL 10 TROC 200 (ENDOMECHANICALS)
BENZOIN TINCTURE PRP APPL 2/3 (GAUZE/BANDAGES/DRESSINGS) ×2 IMPLANT
CABLE HIGH FREQUENCY MONO STRZ (ELECTRODE) ×2 IMPLANT
CHLORAPREP W/TINT 26ML (MISCELLANEOUS) ×2 IMPLANT
CLIP APPLIE 5 13 M/L LIGAMAX5 (MISCELLANEOUS) IMPLANT
CLIP LIGATING HEMO O LOK GREEN (MISCELLANEOUS) ×4 IMPLANT
COVER MAYO STAND STRL (DRAPES) IMPLANT
COVER SURGICAL LIGHT HANDLE (MISCELLANEOUS) ×2 IMPLANT
COVER TRANSDUCER ULTRASND (DRAPES) ×2 IMPLANT
DECANTER SPIKE VIAL GLASS SM (MISCELLANEOUS) ×2 IMPLANT
DEVICE TROCAR PUNCTURE CLOSURE (ENDOMECHANICALS) IMPLANT
DRAPE C-ARM 42X120 X-RAY (DRAPES) IMPLANT
ELECT REM PT RETURN 15FT ADLT (MISCELLANEOUS) ×2 IMPLANT
GAUZE SPONGE 2X2 8PLY STRL LF (GAUZE/BANDAGES/DRESSINGS) ×1 IMPLANT
GLOVE BIO SURGEON STRL SZ7.5 (GLOVE) ×2 IMPLANT
GOWN STRL REUS W/TWL XL LVL3 (GOWN DISPOSABLE) ×4 IMPLANT
HEMOSTAT SURGICEL 4X8 (HEMOSTASIS) IMPLANT
IRRIG SUCT STRYKERFLOW 2 WTIP (MISCELLANEOUS) ×2
IRRIGATION SUCT STRKRFLW 2 WTP (MISCELLANEOUS) ×1 IMPLANT
KIT BASIN OR (CUSTOM PROCEDURE TRAY) ×2 IMPLANT
NDL INSUFFLATION 14GA 120MM (NEEDLE) ×1 IMPLANT
NEEDLE INSUFFLATION 14GA 120MM (NEEDLE) ×2 IMPLANT
POUCH RETRIEVAL ECOSAC 10 (ENDOMECHANICALS) IMPLANT
POUCH RETRIEVAL ECOSAC 10MM (ENDOMECHANICALS)
SCISSORS LAP 5X35 DISP (ENDOMECHANICALS) ×2 IMPLANT
SET CHOLANGIOGRAPH MIX (MISCELLANEOUS) IMPLANT
SLEEVE XCEL OPT CAN 5 100 (ENDOMECHANICALS) ×2 IMPLANT
SPONGE GAUZE 2X2 STER 10/PKG (GAUZE/BANDAGES/DRESSINGS) ×1
STRIP CLOSURE SKIN 1/2X4 (GAUZE/BANDAGES/DRESSINGS) ×2 IMPLANT
SUT MNCRL AB 4-0 PS2 18 (SUTURE) ×2 IMPLANT
TOWEL OR 17X26 10 PK STRL BLUE (TOWEL DISPOSABLE) ×2 IMPLANT
TOWEL OR NON WOVEN STRL DISP B (DISPOSABLE) ×2 IMPLANT
TRAY LAPAROSCOPIC (CUSTOM PROCEDURE TRAY) ×2 IMPLANT
TROCAR BLADELESS OPT 5 100 (ENDOMECHANICALS) ×2 IMPLANT
TROCAR XCEL NON-BLD 11X100MML (ENDOMECHANICALS) ×2 IMPLANT
TUBING INSUF HEATED (TUBING) ×2 IMPLANT

## 2017-01-15 NOTE — Anesthesia Postprocedure Evaluation (Addendum)
Anesthesia Post Note  Patient: Stanley Petty  Procedure(s) Performed: Procedure(s) (LRB): LAPAROSCOPIC CHOLECYSTECTOMY (N/A)  Patient location during evaluation: PACU Anesthesia Type: General Level of consciousness: awake and alert Pain management: pain level controlled Vital Signs Assessment: post-procedure vital signs reviewed and stable Respiratory status: spontaneous breathing, nonlabored ventilation, respiratory function stable and patient connected to nasal cannula oxygen Cardiovascular status: blood pressure returned to baseline and stable Postop Assessment: no signs of nausea or vomiting Anesthetic complications: no       Last Vitals:  Vitals:   01/15/17 1634 01/15/17 1835  BP: 121/76 122/77  Pulse: (!) 50 (!) 50  Resp: 15 18  Temp: 37 C 36.9 C    Last Pain:  Vitals:   01/15/17 1835  TempSrc: Oral  PainSc:                  [name]

## 2017-01-15 NOTE — Discharge Instructions (Signed)
[facility] ______[facility] °LAPAROSCOPIC SURGERY: POST OP INSTRUCTIONS °Always review your discharge instruction sheet given to you by the facility where your surgery was performed. °IF YOU HAVE DISABILITY OR FAMILY LEAVE FORMS, YOU MUST BRING THEM TO THE OFFICE FOR PROCESSING.   °DO NOT GIVE THEM TO YOUR DOCTOR. ° °1. A prescription for pain medication may be given to you upon discharge.  Take your pain medication as prescribed, if needed.  If narcotic pain medicine is not needed, then you may take acetaminophen (Tylenol) or ibuprofen (Advil) as needed. °2. Take your usually prescribed medications unless otherwise directed. °3. If you need a refill on your pain medication, please contact your pharmacy.  They will contact our office to request authorization. Prescriptions will not be filled after 5pm or on week-ends. °4. You should follow a light diet the first few days after arrival home, such as soup and crackers, etc.  Be sure to include lots of fluids daily. °5. Most patients will experience some swelling and bruising in the area of the incisions.  Ice packs will help.  Swelling and bruising can take several days to resolve.  °6. It is common to experience some constipation if taking pain medication after surgery.  Increasing fluid intake and taking a stool softener (such as Colace) will usually help or prevent this problem from occurring.  A mild laxative (Milk of Magnesia or Miralax) should be taken according to package instructions if there are no bowel movements after 48 hours. °7. Unless discharge instructions indicate otherwise, you may remove your bandages 24-48 hours after surgery, and you may shower at that time.  You may have steri-strips (small skin tapes) in place directly over the incision.  These strips should be left on the skin for 7-10 days.  If your surgeon used skin glue on the incision, you may shower in 24 hours.  The glue will flake off over the next 2-3 weeks.  Any sutures or  staples will be removed at the office during your follow-up visit. °8. ACTIVITIES:  You may resume regular (light) daily activities beginning the next day--such as daily self-care, walking, climbing stairs--gradually increasing activities as tolerated.  You may have sexual intercourse when it is comfortable.  Refrain from any heavy lifting or straining until approved by your doctor. °a. You may drive when you are no longer taking prescription pain medication, you can comfortably wear a seatbelt, and you can safely maneuver your car and apply brakes. °b. RETURN TO WORK:  __________________________________________________________ °9. You should see your doctor in the office for a follow-up appointment approximately 2-3 weeks after your surgery.  Make sure that you call for this appointment within a day or two after you arrive home to insure a convenient appointment time. °10. OTHER INSTRUCTIONS: __________________________________________________________________________________________________________________________ __________________________________________________________________________________________________________________________ °WHEN TO CALL YOUR DOCTOR: °1. Fever over 101.0 °2. Inability to urinate °3. Continued bleeding from incision. °4. Increased pain, redness, or drainage from the incision. °5. Increasing abdominal pain ° °The clinic staff is available to answer your questions during regular business hours.  Please don’t hesitate to call and ask to speak to one of the nurses for clinical concerns.  If you have a medical emergency, go to the nearest emergency room or call 911.  A surgeon from [facility] is always on call at the hospital. °[address], [address], [place], [place]  [address] ? [address] [address], [place], [place]   [address] °[phone] ? [phone] ? FAX [phone] °Web site:   [email or web address] °

## 2017-01-15 NOTE — H&P (View-Only) (Signed)
History of Present Illness Ralene Ok MD; 12/18/2016 11:26 AM) The patient is a 51 year old male who presents for evaluation of gall stones. The patient is a 51 year old male who is referred by Dr. Larose Kells for evaluation of biliary dyskinesia. Patient's had a history of right upper quadrant pain. He states this was associated nausea but no emesis. Patient had a previous HIDA scan which reveals a 17% ejection fraction. Patient ultrasound revealed no stones and no signs of cholecystitis.   Past Surgical History Malachy Moan, [place]; 12/18/2016 10:41 AM) Colon Polyp Removal - Colonoscopy   Diagnostic Studies History Malachy Moan, RMA; 12/18/2016 10:41 AM) Colonoscopy  within last year  Allergies Malachy Moan, RMA; 12/18/2016 10:42 AM) No Known Allergies 12/18/2016  Medication History Malachy Moan, RMA; 12/18/2016 10:43 AM) Pantoprazole Sodium (40MG  Tablet DR, Oral) Active. Bentyl (Oral) Specific strength unknown - Active. Medications Reconciled  Social History Malachy Moan, [place]; 12/18/2016 10:41 AM) Alcohol use  Occasional alcohol use. Caffeine use  Carbonated beverages, Coffee, Tea. Tobacco use  Current every day smoker.  Family History Malachy Moan, [place]; 12/18/2016 10:41 AM) Colon Cancer  Family Members In General. Colon Polyps  Mother. Prostate Cancer  Father.  Other Problems Malachy Moan, [place]; 12/18/2016 10:41 AM) Hemorrhoids     Review of Systems Malachy Moan RMA; 12/18/2016 10:41 AM) General Present- Fatigue. Not Present- Appetite Loss, Chills, Fever, Night Sweats, Weight Gain and Weight Loss. Skin Not Present- Change in Wart/Mole, Dryness, Hives, Jaundice, New Lesions, Non-Healing Wounds, Rash and Ulcer. HEENT Not Present- Earache, Hearing Loss, Hoarseness, Nose Bleed, Oral Ulcers, Ringing in the Ears, Seasonal Allergies, Sinus Pain, Sore Throat, Visual Disturbances, Wears glasses/contact lenses and Yellow Eyes. Respiratory Not  Present- Bloody sputum, Chronic Cough, Difficulty Breathing, Snoring and Wheezing. Breast Not Present- Breast Mass, Breast Pain, Nipple Discharge and Skin Changes. Cardiovascular Present- Leg Cramps and Shortness of Breath. Not Present- Chest Pain, Difficulty Breathing Lying Down, Palpitations, Rapid Heart Rate and Swelling of Extremities. Gastrointestinal Present- Abdominal Pain, Chronic diarrhea and Indigestion. Not Present- Bloating, Bloody Stool, Change in Bowel Habits, Constipation, Difficulty Swallowing, Excessive gas, Gets full quickly at meals, Hemorrhoids, Nausea, Rectal Pain and Vomiting. Male Genitourinary Not Present- Blood in Urine, Change in Urinary Stream, Frequency, Impotence, Nocturia, Painful Urination, Urgency and Urine Leakage. Musculoskeletal Not Present- Back Pain, Joint Pain, Joint Stiffness, Muscle Pain, Muscle Weakness and Swelling of Extremities. Neurological Not Present- Decreased Memory, Fainting, Headaches, Numbness, Seizures, Tingling, Tremor, Trouble walking and Weakness. Psychiatric Not Present- Anxiety, Bipolar, Change in Sleep Pattern, Depression, Fearful and Frequent crying. Endocrine Not Present- Cold Intolerance, Excessive Hunger, Hair Changes, Heat Intolerance, Hot flashes and New Diabetes. Hematology Not Present- Blood Thinners, Easy Bruising, Excessive bleeding, Gland problems, HIV and Persistent Infections.  Vitals Malachy Moan RMA; 12/18/2016 10:43 AM) 12/18/2016 10:43 AM Weight: 163.6 lb Height: 76in Body Surface Area: 2.03 m Body Mass Index: 19.91 kg/m  Temp.: 98.3F  Pulse: 71 (Regular)  BP: 130/92 (Sitting, Left Arm, Standard)       Physical Exam Ralene Ok, MD; 12/18/2016 11:27 AM) General Mental Status-Alert. General Appearance-Consistent with stated age. Hydration-Well hydrated. Voice-Normal.  Head and Neck Head-normocephalic, atraumatic with no lesions or palpable masses.  Eye Eyeball -  Bilateral-Extraocular movements intact. Sclera/Conjunctiva - Bilateral-No scleral icterus.  Chest and Lung Exam Chest and lung exam reveals -quiet, even and easy respiratory effort with no use of accessory muscles. Inspection Chest Wall - Normal. Back - normal.  Cardiovascular Cardiovascular examination reveals -normal heart sounds, regular rate and rhythm with  no murmurs.  Abdomen Inspection Normal Exam - No Hernias. Palpation/Percussion Normal exam - Soft, Non Tender, No Rebound tenderness, No Rigidity (guarding) and No hepatosplenomegaly. Auscultation Normal exam - Bowel sounds normal.  Neurologic Neurologic evaluation reveals -alert and oriented x 3 with no impairment of recent or remote memory. Mental Status-Normal.  Musculoskeletal Normal Exam - Left-Upper Extremity Strength Normal and Lower Extremity Strength Normal. Normal Exam - Right-Upper Extremity Strength Normal, Lower Extremity Weakness.    Assessment & Plan Ralene Ok MD; 12/18/2016 11:26 AM) BILIARY DYSKINESIA (K82.8) Impression: 51 year old male with biliary dyskinesia.  1. We will proceed to the operating room for a laparoscopic cholecystectomy  2. Risks and benefits were discussed with the patient to generally include, but not limited to: infection, bleeding, possible need for post op ERCP, damage to the bile ducts, bile leak, and possible need for further surgery. Alternatives were offered and described. All questions were answered and the patient voiced understanding of the procedure and wishes to proceed at this point with a laparoscopic cholecystectomy

## 2017-01-15 NOTE — Transfer of Care (Signed)
Immediate Anesthesia Transfer of Care Note  Patient: Stanley Petty  Procedure(s) Performed: Procedure(s): LAPAROSCOPIC CHOLECYSTECTOMY (N/A)  Patient Location: PACU  Anesthesia Type:General  Level of Consciousness: awake, alert  and oriented  Airway & Oxygen Therapy: Patient Spontanous Breathing and Patient connected to face mask oxygen  Post-op Assessment: Report given to RN, Post -op Vital signs reviewed and stable and c/o pain  Post vital signs: Reviewed and stable  Last Vitals:  Vitals:   01/15/17 1200  BP: 124/74  Pulse: (!) 58  Resp: 18  Temp: 36.8 C    Last Pain:  Vitals:   01/15/17 1200  TempSrc: Oral      Patients Stated Pain Goal: 4 (96/22/29 7989)  Complications: No apparent anesthesia complications

## 2017-01-15 NOTE — Op Note (Signed)
01/15/2017  2:58 PM  PATIENT:  Stanley Petty  51 y.o. male  PRE-OPERATIVE DIAGNOSIS:  Biliary Dyskinesia  POST-OPERATIVE DIAGNOSIS:  Biliary Dyskinesia, omental adhesions to liver and right upper quadrant  PROCEDURE:  Procedure(s): LAPAROSCOPIC CHOLECYSTECTOMY (N/A)  SURGEON:  Surgeon(s) and Role:    * Ralene Ok, MD - Primary  ANESTHESIA:   local and general  EBL:  5cc  BLOOD ADMINISTERED:none  DRAINS: none   LOCAL MEDICATIONS USED:  BUPIVICAINE   SPECIMEN:  Source of Specimen:  gallbladder  DISPOSITION OF SPECIMEN:  PATHOLOGY  COUNTS:  YES  TOURNIQUET:  * No tourniquets in log *  DICTATION: .Dragon Dictation   Indications for procedure: Pt is a 51 y/o M with RUQ pain and thought to have biliary dyskenesia.  Pt decided for elective cholecystectomy  Details of the procedure: The patient was taken to the operating and placed in the supine position with bilateral SCDs in place. A time out was called and all facts were verified. A pneumoperitoneum was obtained via A Veress needle technique to a pressure of 43mm of mercury. A 57mm trochar was then placed in the right upper quadrant under visualization, and there were no injuries to any abdominal organs. A 11 mm port was then placed in the umbilical region after infiltrating with local anesthesia under direct visualization. A second epigastric port was placed under direct visualization.   The gallbladder was identified and retracted, the peritoneum was then sharply dissected from the gallbladder and this dissection was carried down to Calot's triangle. The cystic duct was identified and dissected circumferentially and seen going into the gallbladder 360.  The cystic artery was dissected away from the surrounding tissues, and was very short from the right hepatic artery which could easily be seen.   The critical angle was obtained.    2 clips were placed proximally one distally and the cystic duct transected. The cystic  artery was identified and 2 clips placed proximally and one distally and transected. We then proceeded to remove the gallbladder off the hepatic fossa with Bovie cautery. A retrieval bag was then placed in the abdomen and gallbladder placed in the bag. The hepatic fossa was then reexamined and hemostasis was achieved with Bovie cautery and was excellent at this portion of the case. The subhepatic fossa and perihepatic fossa was then irrigated until the effluent was clear. The specimen bag and specimen were removed from the abdominal cavity.  The 11 mm trocar fascia was reapproximated with the Endo Close #1 Vicryl x1. The pneumoperitoneum was evacuated and all trochars removed under direct visulalization. The skin was then closed with 4-0 Monocryl and the skin dressed with Steri-Strips, gauze, and tape. The patient was awaken from general anesthesia and taken to the recovery room in stable condition.    PLAN OF CARE: Discharge to home after PACU  PATIENT DISPOSITION:  PACU - hemodynamically stable.   Delay start of Pharmacological VTE agent (>24hrs) due to surgical blood loss or risk of bleeding: not applicable

## 2017-01-15 NOTE — Anesthesia Preprocedure Evaluation (Addendum)
Anesthesia Evaluation  Patient identified by MRN, date of birth, ID band Patient awake    Reviewed: Allergy & Precautions, NPO status , Patient's Chart, lab work & pertinent test results  Airway Mallampati: II  TM Distance: >3 FB Neck ROM: Full    Dental no notable dental hx. (+) Dental Advisory Given   Pulmonary Current Smoker,    Pulmonary exam normal        Cardiovascular negative cardio ROS Normal cardiovascular exam     Neuro/Psych negative neurological ROS  negative psych ROS   GI/Hepatic Neg liver ROS, GERD  ,  Endo/Other  negative endocrine ROS  Renal/GU negative Renal ROS  negative genitourinary   Musculoskeletal negative musculoskeletal ROS (+)   Abdominal   Peds negative pediatric ROS (+)  Hematology negative hematology ROS (+)   Anesthesia Other Findings   Reproductive/Obstetrics negative OB ROS                             Anesthesia Physical Anesthesia Plan  ASA: II  Anesthesia Plan: General   Post-op Pain Management:    Induction: Intravenous  Airway Management Planned: Oral ETT  Additional Equipment:   Intra-op Plan:   Post-operative Plan: Extubation in OR  Informed Consent: I have reviewed the patients History and Physical, chart, labs and discussed the procedure including the risks, benefits and alternatives for the proposed anesthesia with the patient or authorized representative who has indicated his/her understanding and acceptance.   Dental advisory given  Plan Discussed with: CRNA and Anesthesiologist  Anesthesia Plan Comments:         Anesthesia Quick Evaluation

## 2017-01-15 NOTE — Interval H&P Note (Signed)
History and Physical Interval Note:  01/15/2017 1:45 PM  Stanley Petty  has presented today for surgery, with the diagnosis of Biliary Dyskinesia  The various methods of treatment have been discussed with the patient and family. After consideration of risks, benefits and other options for treatment, the patient has consented to  Procedure(s): LAPAROSCOPIC CHOLECYSTECTOMY (N/A) as a surgical intervention .  The patient's history has been reviewed, patient examined, no change in status, stable for surgery.  I have reviewed the patient's chart and labs.  Questions were answered to the patient's satisfaction.     Rosario Jacks., Anne Hahn

## 2017-01-15 NOTE — Anesthesia Procedure Notes (Signed)
Procedure Name: Intubation Date/Time: 01/15/2017 2:22 PM Performed by: Carleene Cooper A Pre-anesthesia Checklist: Patient identified, Emergency Drugs available, Patient being monitored, Timeout performed and Suction available Patient Re-evaluated:Patient Re-evaluated prior to inductionOxygen Delivery Method: Circle system utilized Preoxygenation: Pre-oxygenation with 100% oxygen Intubation Type: IV induction Ventilation: Mask ventilation without difficulty Laryngoscope Size: Mac Grade View: Grade I Tube type: Oral Tube size: 7.5 mm Number of attempts: 1 Airway Equipment and Method: Stylet Placement Confirmation: ETT inserted through vocal cords under direct vision,  positive ETCO2 and breath sounds checked- equal and bilateral Secured at: 22 cm Tube secured with: Tape Dental Injury: Teeth and Oropharynx as per pre-operative assessment

## 2017-01-18 ENCOUNTER — Encounter (HOSPITAL_COMMUNITY): Payer: Self-pay | Admitting: General Surgery

## 2017-03-27 NOTE — Addendum Note (Signed)
Addendum  created 03/27/17 1045 by Duane Boston, MD   Sign clinical note

## 2017-06-04 ENCOUNTER — Other Ambulatory Visit: Payer: Self-pay | Admitting: Gastroenterology

## 2017-06-04 DIAGNOSIS — R1084 Generalized abdominal pain: Secondary | ICD-10-CM

## 2017-06-18 ENCOUNTER — Ambulatory Visit
Admission: RE | Admit: 2017-06-18 | Discharge: 2017-06-18 | Disposition: A | Discharge status: Home / Self Care | Payer: BLUE CROSS/BLUE SHIELD | Source: Ambulatory Visit | Attending: Gastroenterology | Admitting: Gastroenterology

## 2017-06-18 DIAGNOSIS — R1084 Generalized abdominal pain: Secondary | ICD-10-CM

## 2017-07-09 ENCOUNTER — Other Ambulatory Visit: Payer: BLUE CROSS/BLUE SHIELD

## 2017-07-19 ENCOUNTER — Inpatient Hospital Stay
Admission: RE | Admit: 2017-07-19 | Discharge: 2017-07-19 | Disposition: A | Discharge status: Home / Self Care | Payer: BLUE CROSS/BLUE SHIELD | Source: Ambulatory Visit | Attending: Gastroenterology | Admitting: Gastroenterology

## 2017-09-22 ENCOUNTER — Ambulatory Visit: Payer: BLUE CROSS/BLUE SHIELD | Admitting: Internal Medicine

## 2017-09-22 ENCOUNTER — Ambulatory Visit (HOSPITAL_BASED_OUTPATIENT_CLINIC_OR_DEPARTMENT_OTHER)
Admission: RE | Admit: 2017-09-22 | Discharge: 2017-09-22 | Disposition: A | Discharge status: Home / Self Care | Payer: BLUE CROSS/BLUE SHIELD | Source: Ambulatory Visit | Attending: Internal Medicine | Admitting: Internal Medicine

## 2017-09-22 ENCOUNTER — Encounter: Payer: Self-pay | Admitting: Internal Medicine

## 2017-09-22 VITALS — BP 126/82 | HR 57 | Temp 97.7°F | Resp 14 | Ht 76.0 in | Wt 161.2 lb

## 2017-09-22 DIAGNOSIS — R0602 Shortness of breath: Secondary | ICD-10-CM

## 2017-09-22 DIAGNOSIS — F419 Anxiety disorder, unspecified: Secondary | ICD-10-CM

## 2017-09-22 DIAGNOSIS — R1013 Epigastric pain: Secondary | ICD-10-CM

## 2017-09-22 LAB — CBC WITH DIFFERENTIAL/PLATELET
Basophils Absolute: 0.1 10*3/uL (ref 0.0–0.1)
Basophils Relative: 1 % (ref 0.0–3.0)
EOS PCT: 1.4 % (ref 0.0–5.0)
Eosinophils Absolute: 0.1 10*3/uL (ref 0.0–0.7)
HEMATOCRIT: 50.2 % (ref 39.0–52.0)
HEMOGLOBIN: 16.7 g/dL (ref 13.0–17.0)
Lymphocytes Relative: 32.2 % (ref 12.0–46.0)
Lymphs Abs: 2.9 10*3/uL (ref 0.7–4.0)
MCHC: 33.3 g/dL (ref 30.0–36.0)
MCV: 95.7 fl (ref 78.0–100.0)
MONOS PCT: 7.6 % (ref 3.0–12.0)
Monocytes Absolute: 0.7 10*3/uL (ref 0.1–1.0)
Neutro Abs: 5.2 10*3/uL (ref 1.4–7.7)
Neutrophils Relative %: 57.8 % (ref 43.0–77.0)
Platelets: 295 10*3/uL (ref 150.0–400.0)
RBC: 5.24 Mil/uL (ref 4.22–5.81)
RDW: 13.7 % (ref 11.5–15.5)
WBC: 8.9 10*3/uL (ref 4.0–10.5)

## 2017-09-22 LAB — BASIC METABOLIC PANEL
BUN: 17 mg/dL (ref 6–23)
CHLORIDE: 103 meq/L (ref 96–112)
CO2: 28 mEq/L (ref 19–32)
Calcium: 9.9 mg/dL (ref 8.4–10.5)
Creatinine, Ser: 0.97 mg/dL (ref 0.40–1.50)
GFR: 85.78 mL/min (ref >60.00)
Glucose, Bld: 97 mg/dL (ref 70–99)
POTASSIUM: 3.9 meq/L (ref 3.5–5.1)
SODIUM: 138 meq/L (ref 135–145)

## 2017-09-22 MED ORDER — PANTOPRAZOLE SODIUM 40 MG PO TBEC: 40.0000 mg | DELAYED_RELEASE_TABLET | Freq: Every day | ORAL | 5 refills | Status: DC

## 2017-09-22 NOTE — Progress Notes (Signed)
Subjective:    Patient ID: Stanley Petty, male    DOB: May 15, 1966, 51 y.o.   MRN: [id number]  DOS:  09/22/2017 Type of visit - description : acute Interval history: Had laparoscopic cholecystectomy 12-2016.  After that she continue with GI symptoms and consulted Stanley Petty. Had a EGD approximately 05-2017: Normal esophagus, + antritis, duodenitis.  BX showing mild chronic gastritis and duodenitis without H. pylori.  Was Rx omeprazole Last visit with GI 08/19/2017: Was felt that the peptic inflammation responding to PPI and they recommend to reduce omeprazole and convert to H2 blockers. Has hematochezia and they were considering banding for hemorrhoidal bleeding.  In the last several weeks has either run out  or try to get off PPIs and every time he developed  Nocturnal  anterior chest and back burning; symptoms usually resolve completely after he vomits once.  Here b/c  he likes to be sure that nothing of the above is related to his heart and also because for the last 4 weeks have noted some SOB. He does not feel that his breathing is labored but he is just slightly short of breath with normal activities like walking or going shopping.  Episodes are random.  He actually does heavy physical work sometimes and he has no symptoms at all.  Wt Readings from Last 3 Encounters:  09/22/17 161 lb 4 oz (73.1 kg)  01/15/17 160 lb (72.6 kg)  01/13/17 160 lb (72.6 kg)    Review of Systems Denies fever chills No substernal chest pain, no chest pain other than the burning as described above. No cough, does not know if he has ever wheeze. Occasional palpitations and anxiety.  He is mostly anxious because the difficulty breathing.   Past Medical History:  Diagnosis Date  . GERD (gastroesophageal reflux disease)   . IBS (irritable bowel syndrome)    Stanley Petty    Past Surgical History:  Procedure Laterality Date  . CHOLECYSTECTOMY N/A 01/15/2017   Procedure: LAPAROSCOPIC CHOLECYSTECTOMY;   Surgeon: Stanley Ok, MD;  Location: [facility];  Service: General;  Laterality: N/A;  . COLONOSCOPY    . CYSTOSCOPY     Stanley Petty  . SURGERY ON TESTICLE     at age 12    Social History   Socioeconomic History  . Marital status: Married    Spouse name: Not on file  . Number of children: 2  . Years of education: Not on file  . Highest education level: Not on file  Social Needs  . Financial resource strain: Not on file  . Food insecurity - worry: Not on file  . Food insecurity - inability: Not on file  . Transportation needs - medical: Not on file  . Transportation needs - non-medical: Not on file  Occupational History  . Occupation: plumbing, has his own co  Tobacco Use  . Smoking status: Current Every Day Smoker    Packs/day: 1.00    Types: Cigarettes  . Smokeless tobacco: Never Used  . Tobacco comment: 1 ppd  Substance and Sexual Activity  . Alcohol use: Yes    Alcohol/week: 0.0 oz    Comment: socially   . Drug use: No  . Sexual activity: Not on file  Other Topics Concern  . Not on file  Social History Narrative  . Not on file      Allergies as of 09/22/2017      Reactions   Vicodin [hydrocodone-acetaminophen] Nausea And Vomiting   Pt states when takes  whole pill has nausea and vomiting but has taken 1/2 tab without difficulty       Medication List        Accurate as of 09/22/17  5:13 PM. Always use your most recent med list.          dicyclomine 20 MG tablet Commonly known as:  BENTYL Take 1 tablet (20 mg total) by mouth every 6 (six) hours as needed for spasms (stomach pain).   pantoprazole 40 MG tablet Commonly known as:  PROTONIX Take 1 tablet (40 mg total) by mouth daily before breakfast.          Objective:   Physical Exam BP 126/82 (BP Location: Left Arm, Patient Position: Sitting, Cuff Size: Small)   Pulse (!) 57   Temp 97.7 F (36.5 C) (Oral)   Resp 14   Ht 6\' 4"  (1.93 m)   Wt 161 lb 4 oz (73.1 kg)   SpO2 97%   BMI 19.63 kg/m    General:   Well developed, well nourished . NAD.  HEENT:  Normocephalic . Face symmetric, atraumatic Lungs:  CTA B Normal respiratory effort, no intercostal retractions, no accessory muscle use. Heart: RRR,  no murmur.  no pretibial edema bilaterally  Abdomen:  Not distended, soft, non-tender. No rebound or rigidity.   Skin: Not pale. Not jaundice Neurologic:  alert & oriented X3.  Speech normal, gait appropriate for age and unassisted Psych--  Cognition and judgment appear intact.  Cooperative with normal attention span and concentration.  Behavior appropriate. + Anxious appearing but no depression.     Assessment & Plan:    Assessment IBS, Stanley Petty Hemorrhoids, h/o banding Exercise tolerance test normal 10-2014 2012: Abdominal pain, [place] 2 liver lesions; HIDA: decreased EF    PLAN Dyspepsia: Documented by EGD, follow-up by GI, unable to wean off omeprazole.  he actually thinks omeprazole is causing some of his sxs.  Rec to try pantoprazole daily,rx sent, follow-up with GI S OB: sx somewhat atypical, he is SOB with mild exertion but able to do heavy physical work without sxs. Pt concerned about his heart, he is a smoker, + FH CAD, last LDL 121, no hypertension.  Exercise stress test 2016 (-);  CXR 11 2017 (-) EKG today at baseline compared to previous one. Cardiovascular risk at 10 years is 10%. We had a very long discussion about how to workup his difficulty breathing, eventually we agree on doing PFTs, if negative consider pulmonary or cardiac referral. Anxiety: I think anxiety is playing a role on his sxs noting also he has IBS, he admits to feeling very anxious.  We briefly discussed SSRIs, pt will think about it. He also had some questions about the gastric biopsy, I could not address that in this 40-minute visit due to a number of other concerns and extensive chart review.  Recommend to discuss with GI RTC to 3 months  Today, I spent more than  42  min with the  patient: >50% of the time counseling regards how to workup his S OB, we talked about possibly stress test or PFTs eventually decided to go for PFTs.  He was very anxious, I tried to reassure him, we briefly talked about SSRIs as well. Also spent a lot of time reviewing his records.

## 2017-09-22 NOTE — Patient Instructions (Addendum)
Schedule a follow-up with me in 2-3 months  Get your blood work  Get a chest x-ray  We will set up pulmonary function test at the [place] office   Stop omeprazole  Start pantoprazole 40 mg 1 tablet daily  See your stomach doctor  For anxiety, consider the use of Lexapro, fluoxetine or similar medications

## 2017-09-22 NOTE — Assessment & Plan Note (Signed)
Dyspepsia: Documented by EGD, follow-up by GI, unable to wean off omeprazole.  he actually thinks omeprazole is causing some of his sxs.  Rec to try pantoprazole daily,rx sent, follow-up with GI S OB: sx somewhat atypical, he is SOB with mild exertion but able to do heavy physical work without sxs. Pt concerned about his heart, he is a smoker, + FH CAD, last LDL 121, no hypertension.  Exercise stress test 2016 (-);  CXR 11 2017 (-) EKG today at baseline compared to previous one. Cardiovascular risk at 10 years is 10%. We had a very long discussion about how to workup his difficulty breathing, eventually we agree on doing PFTs, if negative consider pulmonary or cardiac referral. Anxiety: I think anxiety is playing a role on his sxs noting also he has IBS, he admits to feeling very anxious.  We briefly discussed SSRIs, pt will think about it. He also had some questions about the gastric biopsy, I could not address that in this 40-minute visit due to a number of other concerns and extensive chart review.  Recommend to discuss with GI RTC to 3 months

## 2017-09-22 NOTE — Progress Notes (Signed)
Pre visit review using our clinic review tool, if applicable. No additional management support is needed unless otherwise documented below in the visit note. 

## 2017-09-24 ENCOUNTER — Other Ambulatory Visit: Payer: Self-pay | Admitting: Gastroenterology

## 2017-09-24 DIAGNOSIS — R1084 Generalized abdominal pain: Secondary | ICD-10-CM

## 2017-09-24 DIAGNOSIS — K769 Liver disease, unspecified: Secondary | ICD-10-CM

## 2017-09-27 ENCOUNTER — Other Ambulatory Visit: Payer: BLUE CROSS/BLUE SHIELD

## 2017-12-01 ENCOUNTER — Ambulatory Visit: Payer: BLUE CROSS/BLUE SHIELD | Admitting: Internal Medicine

## 2017-12-01 DIAGNOSIS — Z0289 Encounter for other administrative examinations: Secondary | ICD-10-CM

## 2017-12-07 ENCOUNTER — Encounter: Payer: Self-pay | Admitting: Internal Medicine

## 2018-03-26 DIAGNOSIS — C4490 Unspecified malignant neoplasm of skin, unspecified: Secondary | ICD-10-CM

## 2018-03-26 HISTORY — DX: Unspecified malignant neoplasm of skin, unspecified: C44.90

## 2018-04-22 ENCOUNTER — Ambulatory Visit (INDEPENDENT_AMBULATORY_CARE_PROVIDER_SITE_OTHER): Payer: BLUE CROSS/BLUE SHIELD | Admitting: Internal Medicine

## 2018-04-22 ENCOUNTER — Encounter: Payer: Self-pay | Admitting: Internal Medicine

## 2018-04-22 VITALS — BP 116/78 | HR 55 | Temp 98.7°F | Resp 16 | Ht 76.0 in | Wt 160.4 lb

## 2018-04-22 DIAGNOSIS — Z0000 Encounter for general adult medical examination without abnormal findings: Secondary | ICD-10-CM | POA: Diagnosis not present

## 2018-04-22 DIAGNOSIS — Z125 Encounter for screening for malignant neoplasm of prostate: Secondary | ICD-10-CM

## 2018-04-22 DIAGNOSIS — C4490 Unspecified malignant neoplasm of skin, unspecified: Secondary | ICD-10-CM

## 2018-04-22 DIAGNOSIS — Z114 Encounter for screening for human immunodeficiency virus [HIV]: Secondary | ICD-10-CM

## 2018-04-22 DIAGNOSIS — Z1159 Encounter for screening for other viral diseases: Secondary | ICD-10-CM

## 2018-04-22 LAB — LIPID PANEL
CHOLESTEROL: 186 mg/dL (ref 0–200)
HDL: 43.7 mg/dL (ref >39.00)
LDL Cholesterol: 130 mg/dL — ABNORMAL HIGH (ref 0–99)
NonHDL: 141.98
Total CHOL/HDL Ratio: 4
Triglycerides: 62 mg/dL (ref 0.0–149.0)
VLDL: 12.4 mg/dL (ref 0.0–40.0)

## 2018-04-22 LAB — CBC WITH DIFFERENTIAL/PLATELET
Basophils Absolute: 0.1 10*3/uL (ref 0.0–0.1)
Basophils Relative: 0.9 % (ref 0.0–3.0)
EOS PCT: 1.9 % (ref 0.0–5.0)
Eosinophils Absolute: 0.2 10*3/uL (ref 0.0–0.7)
HCT: 44.8 % (ref 39.0–52.0)
Hemoglobin: 15.4 g/dL (ref 13.0–17.0)
LYMPHS ABS: 2.9 10*3/uL (ref 0.7–4.0)
Lymphocytes Relative: 31.4 % (ref 12.0–46.0)
MCHC: 34.3 g/dL (ref 30.0–36.0)
MCV: 93 fl (ref 78.0–100.0)
MONO ABS: 0.7 10*3/uL (ref 0.1–1.0)
Monocytes Relative: 7.2 % (ref 3.0–12.0)
NEUTROS PCT: 58.6 % (ref 43.0–77.0)
Neutro Abs: 5.5 10*3/uL (ref 1.4–7.7)
Platelets: 255 10*3/uL (ref 150.0–400.0)
RBC: 4.81 Mil/uL (ref 4.22–5.81)
RDW: 13.6 % (ref 11.5–15.5)
WBC: 9.4 10*3/uL (ref 4.0–10.5)

## 2018-04-22 LAB — COMPREHENSIVE METABOLIC PANEL
ALT: 16 U/L (ref 0–53)
AST: 16 U/L (ref 0–37)
Albumin: 4 g/dL (ref 3.5–5.2)
Alkaline Phosphatase: 90 U/L (ref 39–117)
BILIRUBIN TOTAL: 0.5 mg/dL (ref 0.2–1.2)
BUN: 19 mg/dL (ref 6–23)
CO2: 24 meq/L (ref 19–32)
Calcium: 9.1 mg/dL (ref 8.4–10.5)
Chloride: 107 mEq/L (ref 96–112)
Creatinine, Ser: 0.91 mg/dL (ref 0.40–1.50)
GFR: 92.13 mL/min (ref >60.00)
Glucose, Bld: 81 mg/dL (ref 70–99)
Potassium: 4.5 mEq/L (ref 3.5–5.1)
SODIUM: 140 meq/L (ref 135–145)
TOTAL PROTEIN: 6.3 g/dL (ref 6.0–8.3)

## 2018-04-22 LAB — TSH: TSH: 0.48 u[IU]/mL (ref 0.35–4.50)

## 2018-04-22 LAB — PSA: PSA: 0.72 ng/mL (ref 0.10–4.00)

## 2018-04-22 NOTE — Progress Notes (Signed)
Pre visit review using our clinic review tool, if applicable. No additional management support is needed unless otherwise documented below in the visit note. 

## 2018-04-22 NOTE — Assessment & Plan Note (Addendum)
--  Td ~ 2012 at ortho per pt (no documentation) -- Prostate cancer screening: Declining DRE, has not seen urology in a while, recommend to see them if he has symptoms.  Check a PSA --CCS: Cscope 10-2011, 1 polyp, Cscope 2017 per Dr. Cher Nakai , next per GI -- (+) FH CAD at early age, patient is a smoker-- exercise stress test (-) 2016;  LDL goal close to 100. rec to restart  ASA . Ca++ coronary score?  Patient not sure, benefits discussed, he will do his own research and talk with his insurance --Tobacco cessation discussed: We again talked about quitting, options include nicotine supplementation, Chantix, Wellbutrin.  Patient not just ready.  Also recommend to see his dentist regularly ---Labs: CMP, FLP, TSH, PSA, HIV, hep C --Diet exercise discussed

## 2018-04-22 NOTE — Progress Notes (Signed)
Subjective:    Patient ID: Stanley Petty, male    DOB: Aug 19, 1966, 52 y.o.   MRN: [id number]  DOS:  04/22/2018 Type of visit - description : cpx Interval history: In addition to CPX, we talk about other issues, see last office visit.  He is feeling great.   Review of Systems  Other than above, a 14 point review of systems is negative      Past Medical History:  Diagnosis Date  . GERD (gastroesophageal reflux disease)   . IBS (irritable bowel syndrome)    Dr Earlean Shawl  . Skin cancer 03/2018   L hand, ?[place]    Past Surgical History:  Procedure Laterality Date  . CHOLECYSTECTOMY N/A 01/15/2017   Procedure: LAPAROSCOPIC CHOLECYSTECTOMY;  Surgeon: Ralene Ok, MD;  Location: [facility];  Service: General;  Laterality: N/A;  . COLONOSCOPY    . CYSTOSCOPY     Dr Risa Grill  . SURGERY ON TESTICLE     at age 81    Social History   Socioeconomic History  . Marital status: Married    Spouse name: Not on file  . Number of children: 2  . Years of education: Not on file  . Highest education level: Not on file  Occupational History  . Occupation: plumbing, has his own co  Social Needs  . Financial resource strain: Not on file  . Food insecurity:    Worry: Not on file    Inability: Not on file  . Transportation needs:    Medical: Not on file    Non-medical: Not on file  Tobacco Use  . Smoking status: Current Every Day Smoker    Packs/day: 1.00    Types: Cigarettes  . Smokeless tobacco: Never Used  . Tobacco comment: 1 ppd  Substance and Sexual Activity  . Alcohol use: Yes    Alcohol/week: 0.0 oz    Comment: socially   . Drug use: No  . Sexual activity: Not on file  Lifestyle  . Physical activity:    Days per week: Not on file    Minutes per session: Not on file  . Stress: Not on file  Relationships  . Social connections:    Talks on phone: Not on file    Gets together: Not on file    Attends religious service: Not on file    Active member of club or organization:  Not on file    Attends meetings of clubs or organizations: Not on file    Relationship status: Not on file  . Intimate partner violence:    Fear of current or ex partner: Not on file    Emotionally abused: Not on file    Physically abused: Not on file    Forced sexual activity: Not on file  Other Topics Concern  . Not on file  Social History Narrative   2 children: 1987, 2008     Family History  Problem Relation Age of Onset  . Heart disease Father 38       MI, stent age 63  . Prostate cancer Father        dx ~ 46  . Hyperlipidemia Sister   . Coronary artery disease Paternal Uncle   . Colon cancer Other        GF  . Colon polyps Mother        also gallstones  . Diabetes Neg Hx      Allergies as of 04/22/2018      Reactions  Vicodin [hydrocodone-acetaminophen] Nausea And Vomiting   Pt states when takes whole pill has nausea and vomiting but has taken 1/2 tab without difficulty       Medication List        Accurate as of 04/22/18 11:59 PM. Always use your most recent med list.          dicyclomine 20 MG tablet Commonly known as:  BENTYL Take 1 tablet (20 mg total) by mouth every 6 (six) hours as needed for spasms (stomach pain).          Objective:   Physical Exam BP 116/78 (BP Location: Left Arm, Patient Position: Sitting, Cuff Size: Small)   Pulse (!) 55   Temp 98.7 F (37.1 C) (Oral)   Resp 16   Ht 6\' 4"  (1.93 m)   Wt 160 lb 6 oz (72.7 kg)   SpO2 97%   BMI 19.52 kg/m  General: Well developed, NAD, see BMI.  Neck: No  thyromegaly  HEENT:  Normocephalic . Face symmetric, atraumatic Lungs:  CTA B Normal respiratory effort, no intercostal retractions, no accessory muscle use. Heart: RRR,  no murmur.  No pretibial edema bilaterally  Abdomen:  Not distended, soft, non-tender. No rebound or rigidity.   Skin: Exposed areas without rash. Not pale. Not jaundice DRE: Declined Neurologic:  alert & oriented X3.  Speech normal, gait appropriate for  age and unassisted Strength symmetric and appropriate for age.  Psych: Cognition and judgment appear intact.  Cooperative with normal attention span and concentration.  Behavior appropriate. No anxious or depressed appearing.     Assessment & Plan:    Assessment IBS, Dr. Cher Nakai Hemorrhoids, h/o banding Skin cancer , BCC? L hand 03/2018 Exercise tolerance test normal 10-2014 2012: Abdominal pain, [place] 2 liver lesions; HIDA: decreased EF    PLAN Dyspepsia: See last visit, symptoms much improved, he stopped PPIs SOB Reports that he stopped PPIs and since then he is completely asymptomatic, did not pursue PFTs Anxiety: Not an issue at this point, reports things are going okay. RTC 1 year

## 2018-04-22 NOTE — Patient Instructions (Signed)
GO TO THE LAB : Get the blood work     GO TO THE FRONT DESK Schedule your next appointment for a physical exam in 1 year  Think about getting a Calcium coronary score  Please see your urologist

## 2018-04-23 NOTE — Assessment & Plan Note (Signed)
Dyspepsia: See last visit, symptoms much improved, he stopped PPIs SOB Reports that he stopped PPIs and since then he is completely asymptomatic, did not pursue PFTs Anxiety: Not an issue at this point, reports things are going okay. RTC 1 year

## 2018-04-24 LAB — HEPATITIS C ANTIBODY
Hepatitis C Ab: NONREACTIVE
SIGNAL TO CUT-OFF: 0.01 (ref <1.00)

## 2018-04-24 LAB — HIV ANTIBODY (ROUTINE TESTING W REFLEX): HIV: NONREACTIVE

## 2018-07-25 ENCOUNTER — Encounter: Payer: Self-pay | Admitting: Internal Medicine

## 2018-07-25 ENCOUNTER — Ambulatory Visit: Payer: BLUE CROSS/BLUE SHIELD | Admitting: Internal Medicine

## 2018-07-25 VITALS — BP 116/74 | HR 78 | Temp 97.8°F | Resp 16 | Ht 76.0 in | Wt 157.4 lb

## 2018-07-25 DIAGNOSIS — S8391XA Sprain of unspecified site of right knee, initial encounter: Secondary | ICD-10-CM

## 2018-07-25 DIAGNOSIS — M7062 Trochanteric bursitis, left hip: Secondary | ICD-10-CM | POA: Diagnosis not present

## 2018-07-25 NOTE — Patient Instructions (Signed)
ICE Left  hip twice a day  Tylenol  500 mg OTC 2 tabs a day every 8 hours as needed for pain  Call if no gradually better

## 2018-07-25 NOTE — Progress Notes (Signed)
Pre visit review using our clinic review tool, if applicable. No additional management support is needed unless otherwise documented below in the visit note. 
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
## 2018-07-26 NOTE — Assessment & Plan Note (Signed)
Sprain, right leg, left trochanteric bursitis Recommend conservative treatment with Tylenol, ice and rest.  The right leg sprain pain is actually better. Declined it NSAIDs. If not better will consider a round of steroids

## 2018-11-02 IMAGING — DX DG CHEST 2V
2 series · 2 of 2 positions shown · non-contrast
Comparison: PA and lateral chest 09/20/2016.

CLINICAL DATA: Shortness of breath for 2 months.  Smoker.

EXAM:
CHEST  2 VIEW

[chest pa]
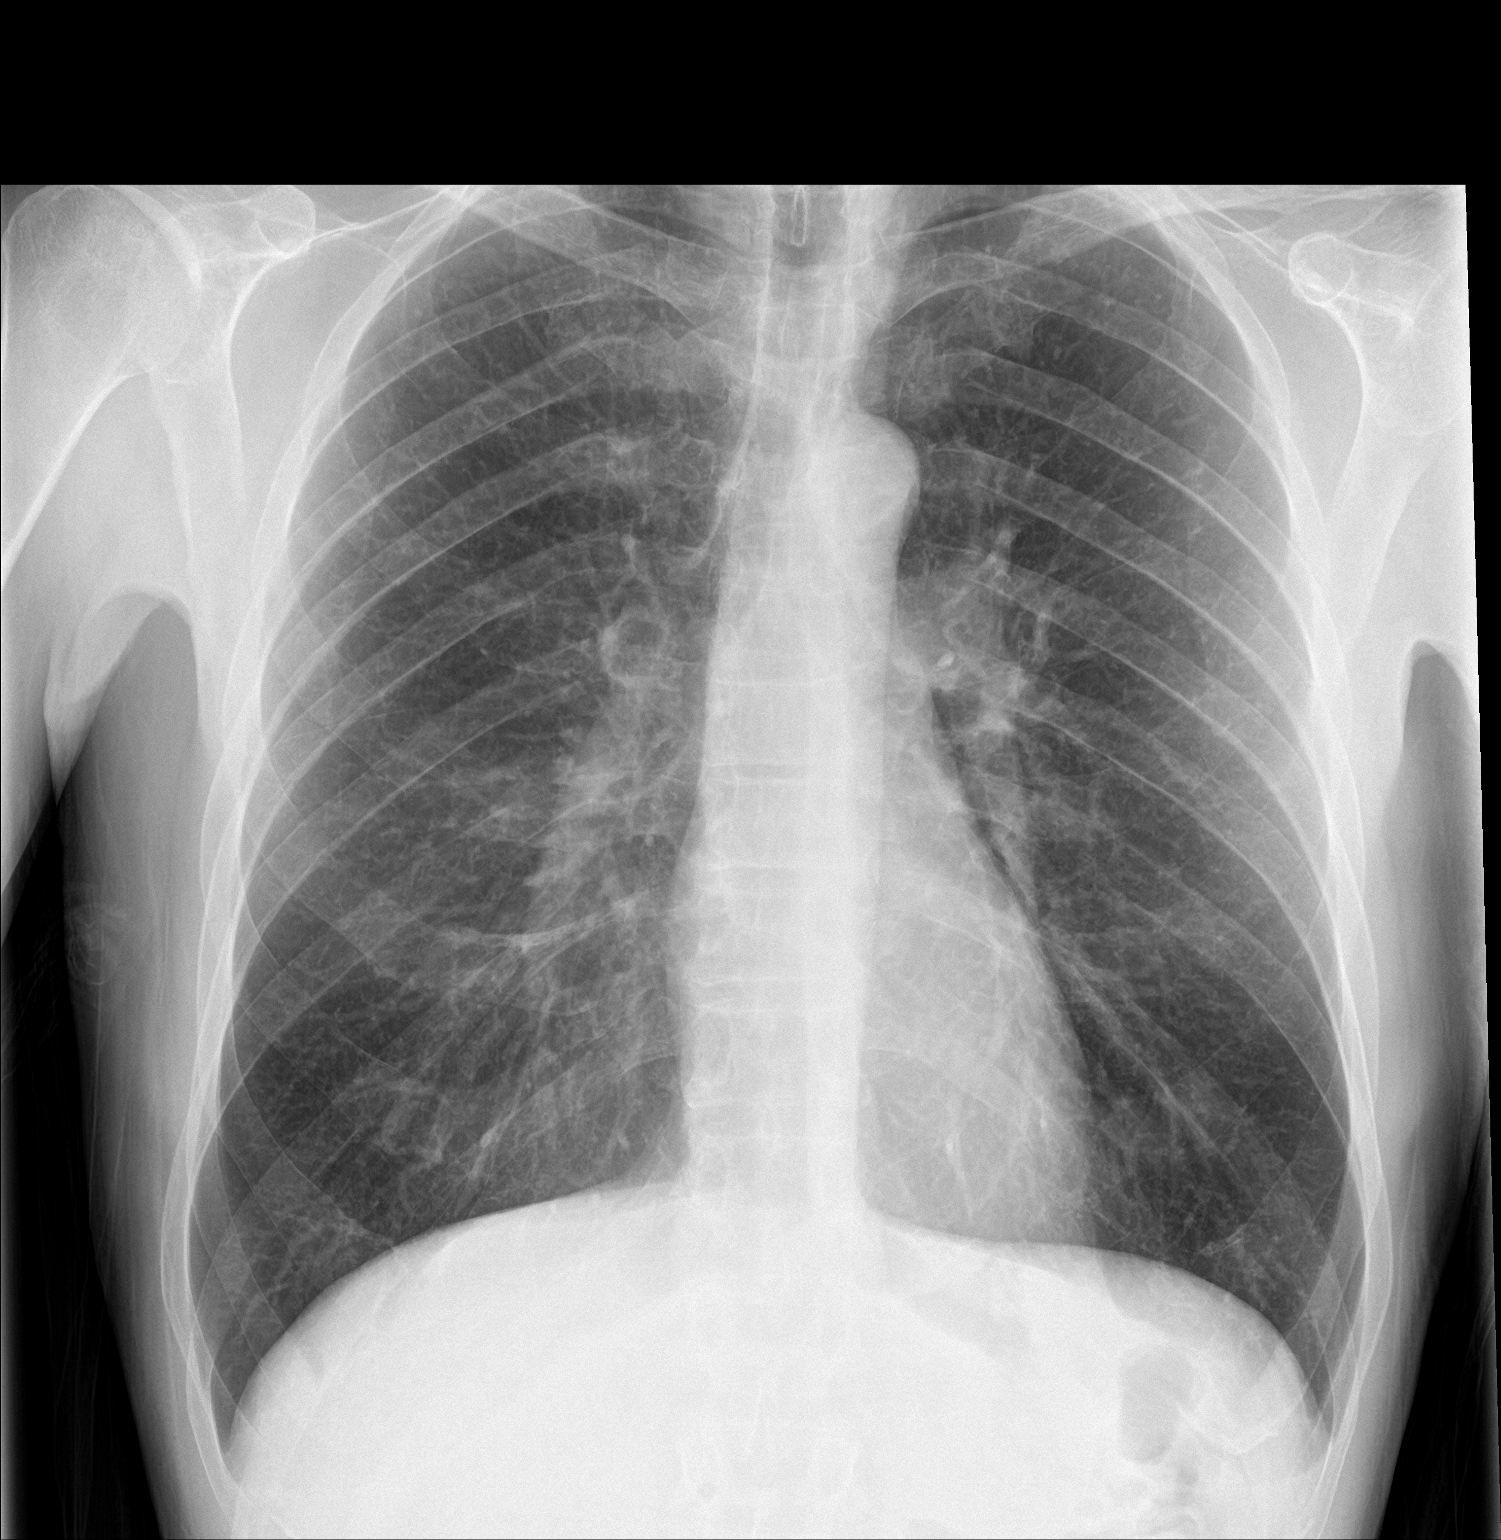

[chest lat]
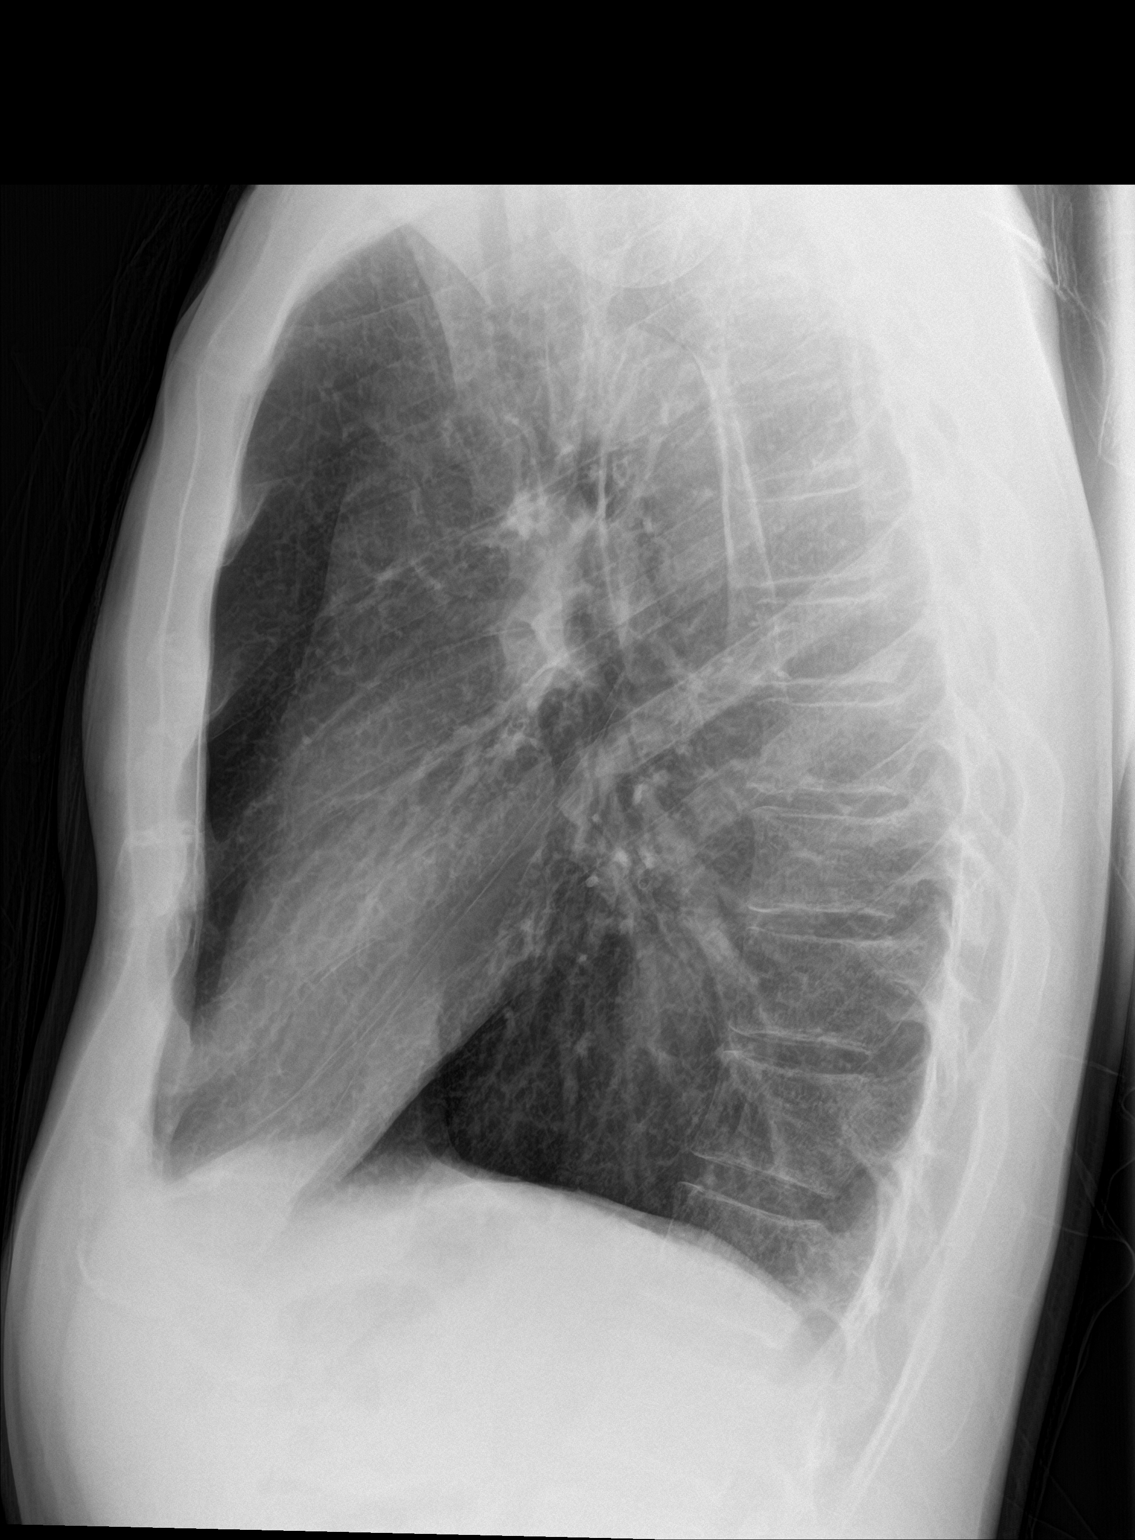

[2 of 2 positions shown; findings below may reference images not displayed]

FINDINGS: The lungs are clear. Heart size is normal. No pneumothorax or
pleural fluid. No bony abnormality.
IMPRESSION: No acute disease.

## 2019-02-04 IMAGING — US US ABDOMEN COMPLETE
1 series · 13 of 25 positions shown · non-contrast
Comparison: 10/12/2011, CT 05/09/1999

CLINICAL DATA: Right upper quadrant pain with bloating and diarrhea
for 5 days

EXAM:
ABDOMEN ULTRASOUND COMPLETE

[Series 1: us abdomen complete · 0.17mm/px · 13 of 105 slices shown]
[im 1/105]
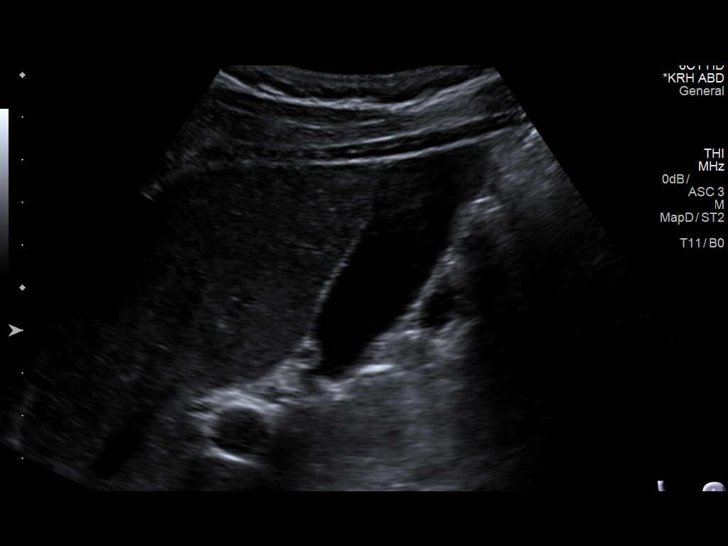
[im 9/105]
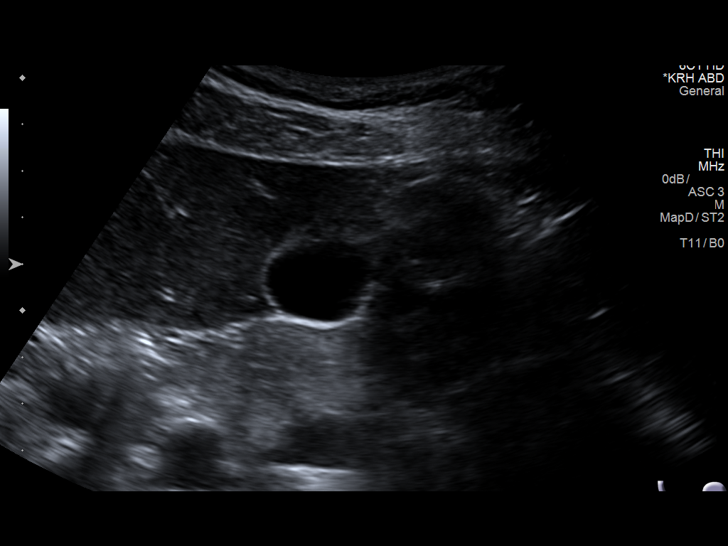
[im 18/105]
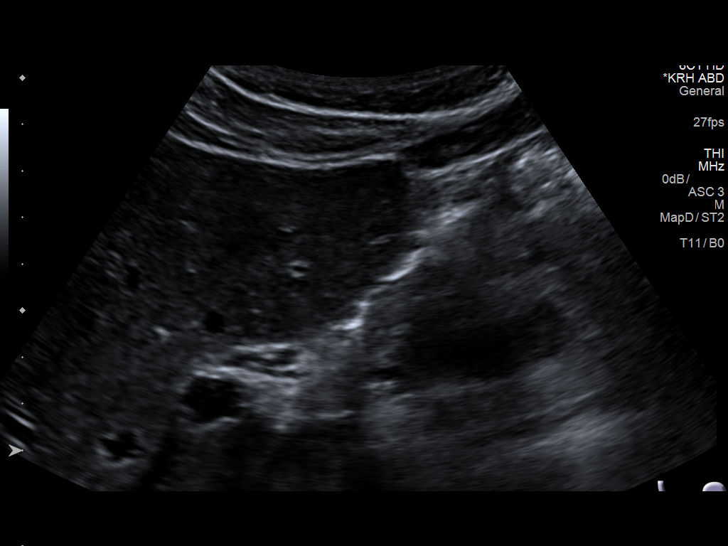
[im 27/105]
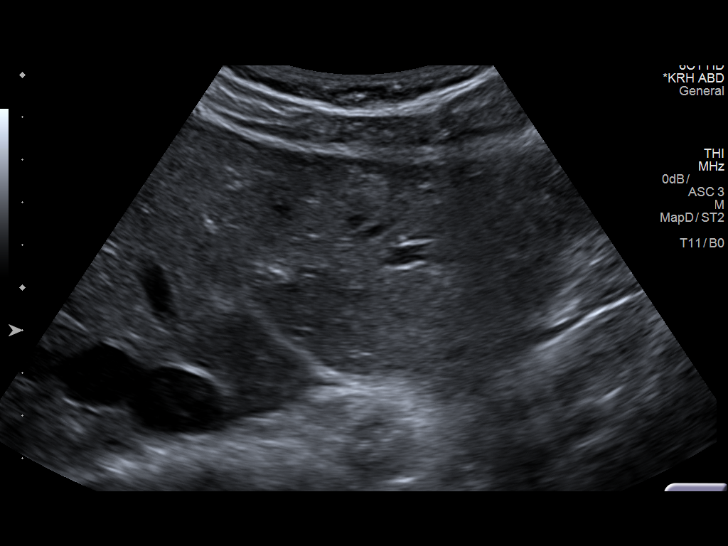
[im 35/105]
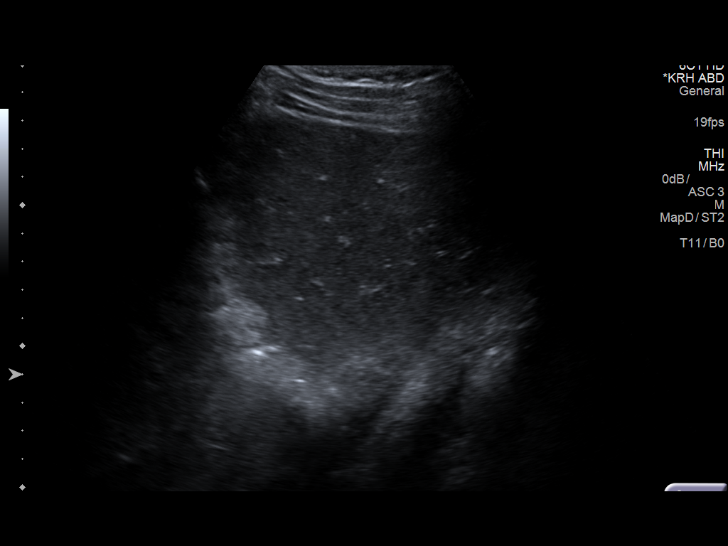
[im 44/105]
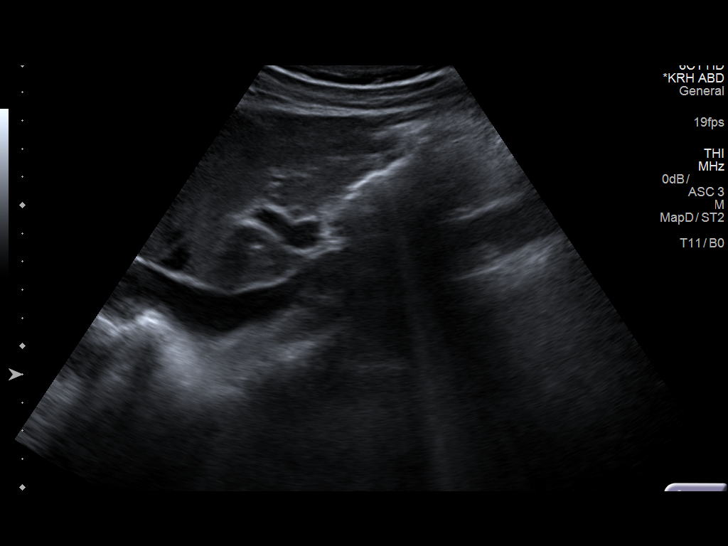
[im 53/105]
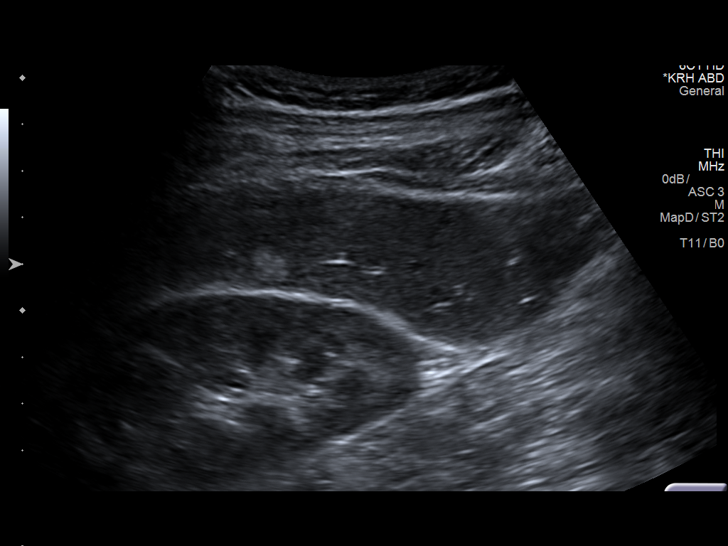
[im 61/105]
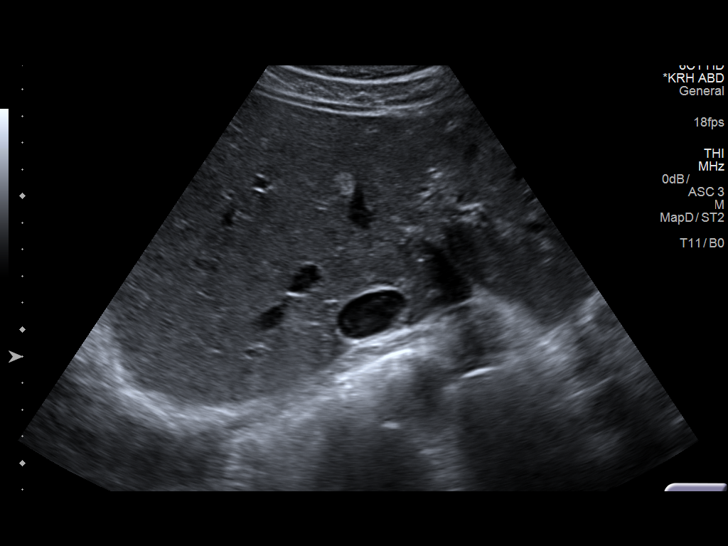
[im 70/105]
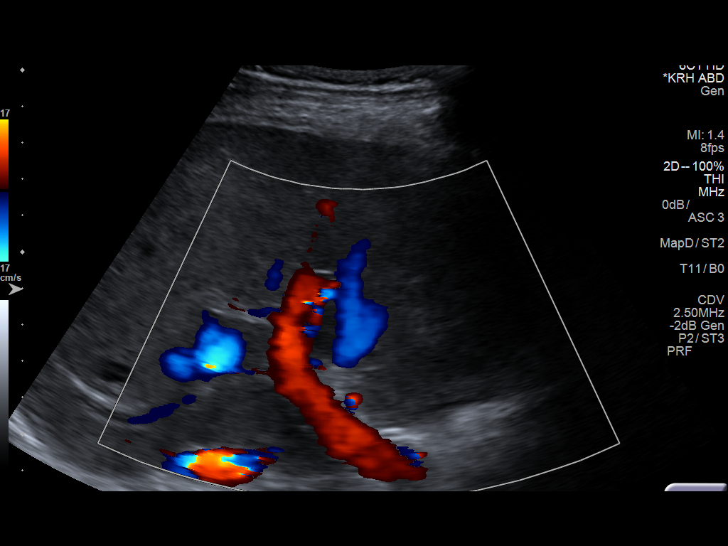
[im 79/105]
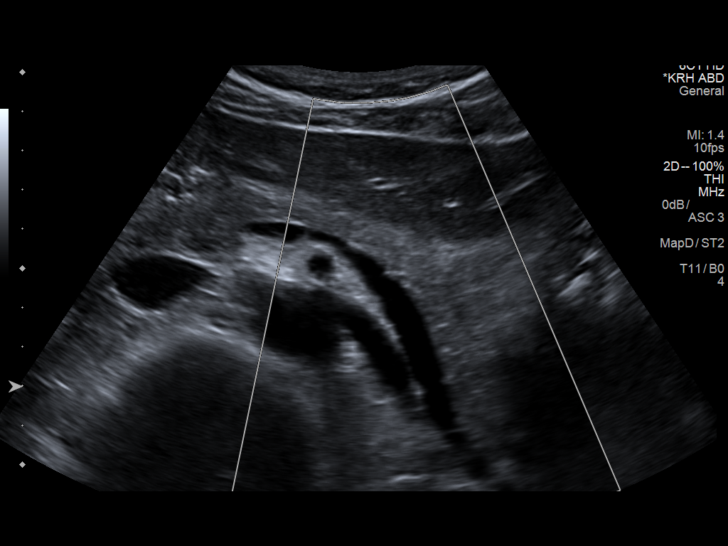
[im 87/105]
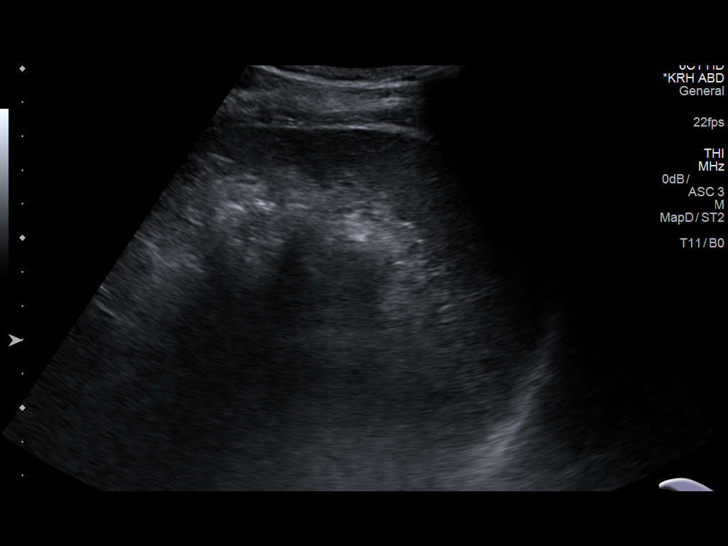
[im 96/105]
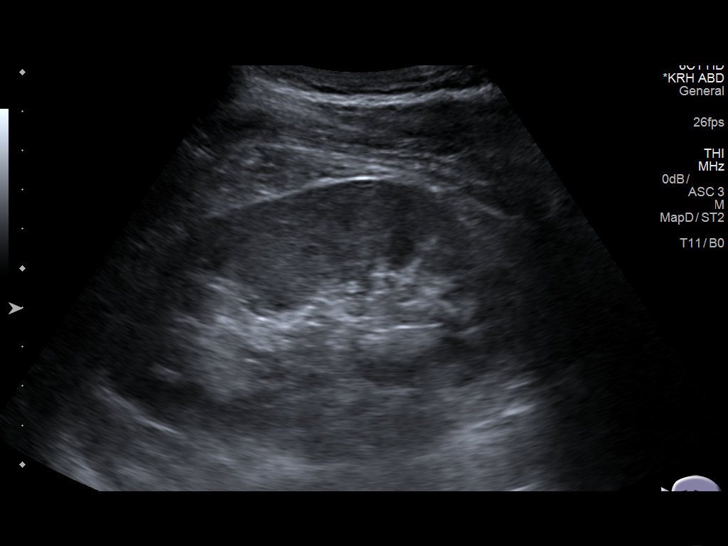
[im 105/105]
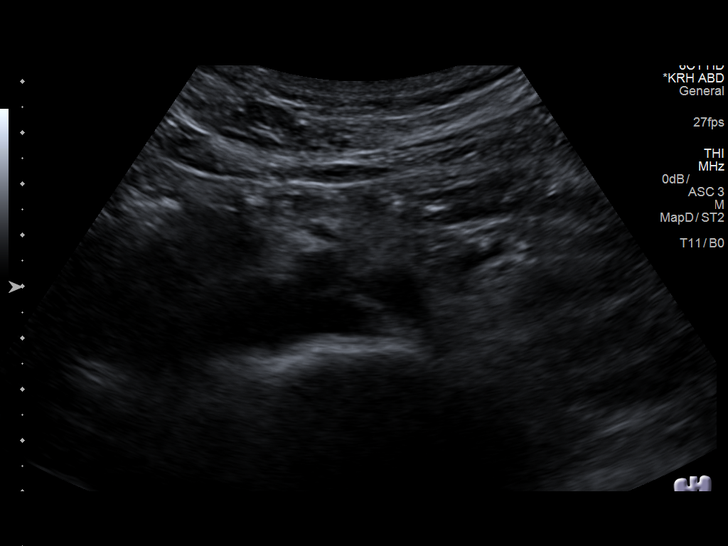

[13 of 25 positions shown; findings below may reference images not displayed]

FINDINGS: Gallbladder: No gallstones or wall thickening visualized. No
sonographic Murphy sign noted by sonographer.

Common bile duct: Diameter: Normal at 2.3 mm

Liver: There are at least 3 echogenic masses within the liver. A
left hepatic lobe lesion measures 0.7 x 0.8 x 0.8 cm, not previously
imaged. In the inferior right hepatic lobe, there is an echogenic
mass measuring 0.8 by 0.9 x 0.8 cm, previously measuring 0.9 x 0.6 x
0.7 cm. Within the central right hepatic lobe is a small echogenic
mass measuring 0.8 x 1 x 0.9 cm, previously measuring 0.7 x 0.7 x
1.1 cm.

IVC: No abnormality visualized.

Pancreas: Visualized portion unremarkable.

Spleen: Size and appearance within normal limits.

Right Kidney: Length: 10.6 cm. Echogenicity within normal limits. No
mass or hydronephrosis visualized.

Left Kidney: Length: 11 cm. Echogenicity within normal limits. No
mass or hydronephrosis visualized.

Abdominal aorta: No aneurysm visualized.

Other findings: None.
IMPRESSION: 1. Negative for acute cholecystitis or cholelithiasis. Negative for
biliary dilatation
2. At least 3 echogenic masses within the liver, two were previously
imaged and are minimally increased in size. Imaging features are
suggestive of small hemangioma. Nonemergent MRI follow-up could be
considered for further evaluation.

## 2019-05-23 ENCOUNTER — Encounter: Payer: Self-pay | Admitting: Internal Medicine

## 2019-12-18 ENCOUNTER — Ambulatory Visit: Payer: BLUE CROSS/BLUE SHIELD | Admitting: Nurse Practitioner

## 2020-01-22 ENCOUNTER — Encounter: Payer: Self-pay | Admitting: *Deleted

## 2020-01-25 ENCOUNTER — Other Ambulatory Visit: Payer: Self-pay

## 2020-01-29 ENCOUNTER — Encounter: Payer: Self-pay | Admitting: Internal Medicine

## 2020-01-29 ENCOUNTER — Other Ambulatory Visit: Payer: Self-pay

## 2020-01-29 ENCOUNTER — Ambulatory Visit (INDEPENDENT_AMBULATORY_CARE_PROVIDER_SITE_OTHER): Payer: 59 | Admitting: Internal Medicine

## 2020-01-29 VITALS — BP 127/73 | HR 66 | Temp 97.7°F | Resp 16 | Ht 76.0 in | Wt 167.1 lb

## 2020-01-29 DIAGNOSIS — Z8249 Family history of ischemic heart disease and other diseases of the circulatory system: Secondary | ICD-10-CM

## 2020-01-29 DIAGNOSIS — Z125 Encounter for screening for malignant neoplasm of prostate: Secondary | ICD-10-CM

## 2020-01-29 DIAGNOSIS — Z0000 Encounter for general adult medical examination without abnormal findings: Secondary | ICD-10-CM

## 2020-01-29 DIAGNOSIS — R399 Unspecified symptoms and signs involving the genitourinary system: Secondary | ICD-10-CM

## 2020-01-29 LAB — CBC WITH DIFFERENTIAL/PLATELET
Basophils Absolute: 0.1 10*3/uL (ref 0.0–0.1)
Basophils Relative: 0.8 % (ref 0.0–3.0)
Eosinophils Absolute: 0.2 10*3/uL (ref 0.0–0.7)
Eosinophils Relative: 2.2 % (ref 0.0–5.0)
HCT: 44.8 % (ref 39.0–52.0)
Hemoglobin: 15.1 g/dL (ref 13.0–17.0)
Lymphocytes Relative: 36.2 % (ref 12.0–46.0)
Lymphs Abs: 2.9 10*3/uL (ref 0.7–4.0)
MCHC: 33.8 g/dL (ref 30.0–36.0)
MCV: 94.6 fl (ref 78.0–100.0)
Monocytes Absolute: 0.6 10*3/uL (ref 0.1–1.0)
Monocytes Relative: 7.9 % (ref 3.0–12.0)
Neutro Abs: 4.3 10*3/uL (ref 1.4–7.7)
Neutrophils Relative %: 52.9 % (ref 43.0–77.0)
Platelets: 235 10*3/uL (ref 150.0–400.0)
RBC: 4.74 Mil/uL (ref 4.22–5.81)
RDW: 14 % (ref 11.5–15.5)
WBC: 8.1 10*3/uL (ref 4.0–10.5)

## 2020-01-29 LAB — COMPREHENSIVE METABOLIC PANEL
ALT: 17 U/L (ref 0–53)
AST: 17 U/L (ref 0–37)
Albumin: 4.1 g/dL (ref 3.5–5.2)
Alkaline Phosphatase: 76 U/L (ref 39–117)
BUN: 13 mg/dL (ref 6–23)
CO2: 29 mEq/L (ref 19–32)
Calcium: 8.8 mg/dL (ref 8.4–10.5)
Chloride: 103 mEq/L (ref 96–112)
Creatinine, Ser: 1.05 mg/dL (ref 0.40–1.50)
GFR: 73.01 mL/min (ref >60.00)
Glucose, Bld: 75 mg/dL (ref 70–99)
Potassium: 4.1 mEq/L (ref 3.5–5.1)
Sodium: 138 mEq/L (ref 135–145)
Total Bilirubin: 0.6 mg/dL (ref 0.2–1.2)
Total Protein: 6.1 g/dL (ref 6.0–8.3)

## 2020-01-29 LAB — URINALYSIS, ROUTINE W REFLEX MICROSCOPIC
Bilirubin Urine: NEGATIVE
Hgb urine dipstick: NEGATIVE
Ketones, ur: NEGATIVE
Leukocytes,Ua: NEGATIVE
Nitrite: NEGATIVE
Specific Gravity, Urine: 1.025 (ref 1.000–1.030)
Total Protein, Urine: NEGATIVE
Urine Glucose: NEGATIVE
Urobilinogen, UA: 0.2 (ref 0.0–1.0)
pH: 5.5 (ref 5.0–8.0)

## 2020-01-29 LAB — LIPID PANEL
Cholesterol: 194 mg/dL (ref 0–200)
HDL: 45.3 mg/dL (ref >39.00)
LDL Cholesterol: 129 mg/dL — ABNORMAL HIGH (ref 0–99)
NonHDL: 148.47
Total CHOL/HDL Ratio: 4
Triglycerides: 95 mg/dL (ref 0.0–149.0)
VLDL: 19 mg/dL (ref 0.0–40.0)

## 2020-01-29 LAB — TSH: TSH: 0.49 u[IU]/mL (ref 0.35–4.50)

## 2020-01-29 LAB — PSA: PSA: 0.93 ng/mL (ref 0.10–4.00)

## 2020-01-29 NOTE — Progress Notes (Signed)
Subjective:    Patient ID: Stanley Petty, male    DOB: Sep 25, 1966, 54 y.o.   MRN: [id number]  DOS:  01/29/2020 Type of visit - description: CPX Here for CPX Has a number of other concerns Occasionally has numbness  of the upper lower extremities, symptoms are positional.  Wonders about his circulation, denies claudication. Occasional nighttime leg cramps. Occasional slow urinary flow but no dysuria or gross hematuria. Occasionally blood per rectum, mostly with BMs, he thinks is hemorrhoids  Review of Systems  Other than above, a 14 point review of systems is negative   Past Medical History:  Diagnosis Date  . Adenomatous colon polyp   . Duodenitis   . Gastritis   . GERD (gastroesophageal reflux disease)   . IBS (irritable bowel syndrome)    Dr Earlean Shawl  . Internal hemorrhoids   . Skin cancer 03/2018   L hand, ?[place]    Past Surgical History:  Procedure Laterality Date  . CHOLECYSTECTOMY N/A 01/15/2017   Procedure: LAPAROSCOPIC CHOLECYSTECTOMY;  Surgeon: Ralene Ok, MD;  Location: [facility];  Service: General;  Laterality: N/A;  . COLONOSCOPY    . CYSTOSCOPY     Dr Risa Grill  . HEMORRHOID BANDING     [place]   . hemorrhoidal banding    . SURGERY ON TESTICLE     at age 11   Family History  Problem Relation Age of Onset  . Heart disease Father 15       MI, stent age 71  . Prostate cancer Father        dx ~ 36  . Esophageal cancer Father 22  . Hyperlipidemia Sister   . Coronary artery disease Paternal Uncle   . Colon polyps Mother        also gallstones  . Colon cancer Maternal Grandfather   . Diabetes Neg Hx   . Stomach cancer Neg Hx      Allergies as of 01/29/2020      Reactions   Vicodin [hydrocodone-acetaminophen] Nausea And Vomiting   Pt states when takes whole pill has nausea and vomiting but has taken 1/2 tab without difficulty       Medication List       Accurate as of January 29, 2020 11:59 PM. If you have any questions, ask your nurse or doctor.         aspirin EC 81 MG tablet Take 81 mg by mouth daily.          Objective:   Physical Exam BP 127/73 (BP Location: Left Arm, Patient Position: Sitting, Cuff Size: Small)   Pulse 66   Temp 97.7 F (36.5 C) (Temporal)   Resp 16   Ht 6\' 4"  (1.93 m)   Wt 167 lb 2 oz (75.8 kg)   SpO2 100%   BMI 20.34 kg/m   General: Well developed, NAD, BMI noted Neck: No  thyromegaly  HEENT:  Normocephalic . Face symmetric, atraumatic Lungs:  CTA B Normal respiratory effort, no intercostal retractions, no accessory muscle use. Heart: RRR,  no murmur.  Abdomen:  Not distended, soft, non-tender. No rebound or rigidity.   Lower extremities: no pretibial edema bilaterally  Skin: Exposed areas without rash. Not pale. Not jaundice DRE: Normal sphincter tone, no stools, prostate normal Neurologic:  alert & oriented X3.  Speech normal, gait appropriate for age and unassisted Strength symmetric and appropriate for age.  Psych: Cognition and judgment appear intact.  Cooperative with normal attention span and concentration.  Behavior appropriate.  No anxious or depressed appearing.     Assessment    Assessment IBS  Hemorrhoids, h/o banding Skin cancer , BCC? L hand 03/2018 Exercise tolerance test normal 10-2014 2012: Abdominal pain, [place] 2 liver lesions; HIDA: decreased EF   PLAN Here for CPX Occasional paresthesias upper lower extremities, positional (i.e. crossing his legs), no further evaluation needed. Minimal LUTS: See comments under physical exam RTC 1 year   This visit occurred during the SARS-CoV-2 public health emergency.  Safety protocols were in place, including screening questions prior to the visit, additional usage of staff PPE, and extensive cleaning of exam room while observing appropriate contact time as indicated for disinfecting solutions.

## 2020-01-29 NOTE — Patient Instructions (Signed)
   GO TO THE LAB : Get the blood work     [place], please reschedule your appointments Come back for for a physical exam in 1 year

## 2020-01-29 NOTE — Progress Notes (Signed)
Pre visit review using our clinic review tool, if applicable. No additional management support is needed unless otherwise documented below in the visit note. 

## 2020-01-30 ENCOUNTER — Telehealth: Payer: Self-pay | Admitting: Cardiovascular Disease

## 2020-01-30 ENCOUNTER — Ambulatory Visit (INDEPENDENT_AMBULATORY_CARE_PROVIDER_SITE_OTHER): Payer: 59 | Admitting: Internal Medicine

## 2020-01-30 ENCOUNTER — Encounter: Payer: Self-pay | Admitting: Internal Medicine

## 2020-01-30 VITALS — BP 118/78 | HR 81 | Temp 98.2°F | Ht 76.0 in | Wt 166.0 lb

## 2020-01-30 DIAGNOSIS — Z86010 Personal history of colonic polyps: Secondary | ICD-10-CM

## 2020-01-30 DIAGNOSIS — Z800 Family history of malignant neoplasm of digestive organs: Secondary | ICD-10-CM | POA: Diagnosis not present

## 2020-01-30 DIAGNOSIS — K625 Hemorrhage of anus and rectum: Secondary | ICD-10-CM

## 2020-01-30 DIAGNOSIS — K2100 Gastro-esophageal reflux disease with esophagitis, without bleeding: Secondary | ICD-10-CM

## 2020-01-30 DIAGNOSIS — K648 Other hemorrhoids: Secondary | ICD-10-CM | POA: Diagnosis not present

## 2020-01-30 DIAGNOSIS — Z01818 Encounter for other preprocedural examination: Secondary | ICD-10-CM

## 2020-01-30 LAB — URINE CULTURE
MICRO NUMBER:: 10327550
Result:: NO GROWTH
SPECIMEN QUALITY:: ADEQUATE

## 2020-01-30 MED ORDER — SUTAB 1479-225-188 MG PO TABS: 1.0000 | ORAL_TABLET | ORAL | 0 refills | Status: DC

## 2020-01-30 NOTE — Progress Notes (Signed)
Patient ID: Stanley Petty, male   DOB: 1966/06/11, 54 y.o.   MRN: [id number] HPI: Stanley Petty is a 54 year old male with a history of GERD with esophagitis, gastroduodenitis without H. pylori, history of colon polyp, internal hemorrhoids with prior banding, family history of esophageal cancer in his father, prior gallbladder disease status post cholecystectomy who is seen to evaluate intermittent rectal bleeding and rectal pressure.  He is here alone today.  He was previously managed by Dr. Earlean Shawl but after an insurance change he is switching to our practice.  He has seen Dr. Earlean Shawl for several years and had an upper endoscopy in August 2018 which showed erosive esophagitis grade A, antritis and erosive duodenitis.  Biopsies were negative for dysplasia and H. pylori.  He had his last colonoscopy on 06/25/2016 which showed moderate internal hemorrhoids but no colonic neoplasia and otherwise normal colonoscopy.  Repeat was recommended in 5 years based on his history of adenomatous polyps.  He reports that recently he has dealt with on and off rectal bleeding primarily with wiping and after bowel movement.  He was also previously having rectal pressure which was seemingly worse with sitting or prolonged sitting.  At times it felt like it was deep at the anal canal but also extending, bilaterally into the buttocks.  He wondered even if this was a prostate issue but reports that primary care did not feel so.  He reports this pressure has subsequently improved tremendously.  He reports his bowel movements have been more regular though at times they can be hard and at other times loose.  He reports he was previously diagnosed with IBS but really feels that most of this was gallbladder related as most symptoms improved after gallbladder surgery.  He did have loose stools after cholecystectomy which cholestyramine was prescribed but this made his stools to firm.  He realized that time needed to pass after surgery for  bowel movements to improve.  Most recently bowel movements are daily usually in the morning and formed but after that they can be loose.  No melena.  He denies heartburn and dysphagia.  He was started on pantoprazole after being diagnosed with esophagitis and EGD in 2018 but this seemed to worsen symptoms so he stopped.  He is also used Benefiber with some success.  His father had esophagus cancer treated surgically.  This was a GE junction tumor.  His maternal grandfather had colon cancer.  He reports his initial polyp removed by Dr. Earlean Shawl prior to his most recent colonoscopy in September 2017 was "large"  Former tobacco user.  Social alcohol use.  He works as a Development worker, community and owns a Scientist, research (physical sciences).  Past Medical History:  Diagnosis Date  . Adenomatous colon polyp   . Duodenitis   . Gastritis   . GERD (gastroesophageal reflux disease)   . IBS (irritable bowel syndrome)    Dr Earlean Shawl  . Internal hemorrhoids   . Skin cancer 03/2018   L hand, ?[facility]    Past Surgical History:  Procedure Laterality Date  . CHOLECYSTECTOMY N/A 01/15/2017   Procedure: LAPAROSCOPIC CHOLECYSTECTOMY;  Surgeon: Ralene Ok, MD;  Location: [facility];  Service: General;  Laterality: N/A;  . COLONOSCOPY    . CYSTOSCOPY     Dr Risa Grill  . HEMORRHOID BANDING     [facility]   . hemorrhoidal banding    . SURGERY ON TESTICLE     at age 8    Outpatient Medications Prior to Visit  Medication Sig Dispense  Refill  . aspirin EC 81 MG tablet Take 81 mg by mouth daily.     No facility-administered medications prior to visit.    Allergies  Allergen Reactions  . Vicodin [Hydrocodone-Acetaminophen] Nausea And Vomiting    Pt states when takes whole pill has nausea and vomiting but has taken 1/2 tab without difficulty     Family History  Problem Relation Age of Onset  . Heart disease Father 59       MI, stent age 4  . Prostate cancer Father        dx ~ 74  . Esophageal cancer Father 52  . Hyperlipidemia Sister    . Coronary artery disease Paternal Uncle   . Colon polyps Mother        also gallstones  . Colon cancer Maternal Grandfather   . Diabetes Neg Hx   . Stomach cancer Neg Hx     Social History   Tobacco Use  . Smoking status: Former Smoker    Packs/day: 1.00    Types: Cigarettes    Quit date: 05/31/2019    Years since quitting: 0.6  . Smokeless tobacco: Never Used  . Tobacco comment: minimal tobacco since ~ 05/2020  Substance Use Topics  . Alcohol use: Yes    Alcohol/week: 0.0 standard drinks    Comment: socially   . Drug use: No    ROS: As per history of present illness, otherwise negative  BP 118/78   Pulse 81   Temp 98.2 F (36.8 C)   Ht 6\' 4"  (1.93 m)   Wt 166 lb (75.3 kg)   BMI 20.21 kg/m  Gen: awake, alert, NAD HEENT: anicteric CV: RRR, no mrg Pulm: CTA b/l Abd: soft, NT/ND, +BS throughout Ext: no c/c/e Neuro: nonfocal   RELEVANT LABS AND IMAGING: CBC    Component Value Date/Time   WBC 8.1 01/29/2020 1344   RBC 4.74 01/29/2020 1344   HGB 15.1 01/29/2020 1344   HCT 44.8 01/29/2020 1344   PLT 235.0 01/29/2020 1344   MCV 94.6 01/29/2020 1344   MCH 31.3 01/13/2017 1040   MCHC 33.8 01/29/2020 1344   RDW 14.0 01/29/2020 1344   LYMPHSABS 2.9 01/29/2020 1344   MONOABS 0.6 01/29/2020 1344   EOSABS 0.2 01/29/2020 1344   BASOSABS 0.1 01/29/2020 1344    CMP     Component Value Date/Time   NA 138 01/29/2020 1344   K 4.1 01/29/2020 1344   CL 103 01/29/2020 1344   CO2 29 01/29/2020 1344   GLUCOSE 75 01/29/2020 1344   BUN 13 01/29/2020 1344   CREATININE 1.05 01/29/2020 1344   CALCIUM 8.8 01/29/2020 1344   PROT 6.1 01/29/2020 1344   ALBUMIN 4.1 01/29/2020 1344   AST 17 01/29/2020 1344   ALT 17 01/29/2020 1344   ALKPHOS 76 01/29/2020 1344   BILITOT 0.6 01/29/2020 1344   GFRNONAA >60 01/13/2017 1040   GFRAA >60 01/13/2017 1040    ASSESSMENT/PLAN: 54 year old male with a history of GERD with esophagitis, gastroduodenitis without H. pylori, history  of colon polyp, internal hemorrhoids with prior banding, family history of esophageal cancer in his father, prior gallbladder disease status post cholecystectomy who is seen to evaluate intermittent rectal bleeding and rectal pressure.  1.  Intermittent rectal bleeding/rectal pressure/internal hemorrhoids --it is very likely that internal hemorrhoids explain his rectal pressure which is probably prolapsing of hemorrhoidal tissue.  Also intermittent bleeding is likely hemorrhoidal as well.  He had rubber band therapy in the past but he  reports at this point he was not having symptoms but this was likely done for prevention.  He knows that he is due another colonoscopy in 2022 and he is interested in proceeding sooner rather than later.  We discussed this at length today.  I do think that it would be reasonable to exclude other rectal pathology given the rectal pain but that if hemorrhoids are found that banding would be the best therapy.  After discussion including the risk, benefits and alternatives we will proceed with colonoscopy --Colonoscopy in the [facility] --Hemorrhoidal banding after colonoscopy if appropriate --Begin Benefiber on a daily basis, 2 tablespoons  2.  GERD with history of esophagitis/family history of esophagus cancer --he was intolerant to pantoprazole.  He did have mild esophagitis at EGD in 2018.  Reasonable to repeat upper endoscopy to ensure healing of esophagitis and exclude Barrett's --EGD in the [facility], [address],  [place] [id number]

## 2020-01-30 NOTE — Patient Instructions (Addendum)
You have been scheduled for an endoscopy and colonoscopy. Please follow the written instructions given to you at your visit today. Please pick up your prep supplies at the pharmacy within the next 1-3 days. If you use inhalers (even only as needed), please bring them with you on the day of your procedure. Your physician has requested that you go to www.startemmi.com and enter the access code given to you at your visit today. This web site gives a general overview about your procedure. However, you should still follow specific instructions given to you by our office regarding your preparation for the procedure.   Please purchase the following medications over the counter and take as directed: Benefiber 2 tablespoons daily  If you are age 56 or older, your body mass index should be between 23-30. Your Body mass index is 20.21 kg/m. If this is out of the aforementioned range listed, please consider follow up with your Primary Care Provider.  If you are age 56 or younger, your body mass index should be between 19-25. Your Body mass index is 20.21 kg/m. If this is out of the aformentioned range listed, please consider follow up with your Primary Care Provider.   Due to recent changes in healthcare laws, you may see the results of your imaging and laboratory studies on MyChart before your provider has had a chance to review them.  We understand that in some cases there may be results that are confusing or concerning to you. Not all laboratory results come back in the same time frame and the provider may be waiting for multiple results in order to interpret others.  Please give [place] 48 hours in order for your provider to thoroughly review all the results before contacting the office for clarification of your results.

## 2020-01-30 NOTE — Telephone Encounter (Signed)
Hey, I had a referral sent over for this patient to schedule with Dr. Johnsie Cancel. He doesn't want to see anyone else because his family see's Dr. Johnsie Cancel as well. I told him I didn't have anything at the moment and deferred the referral until Dr. Kyla Balzarine schedule for July opens up or if there is a cancellation. I'm not sure if theres a spot you or Dr. Johnsie Cancel would like to put him in or if I should just keep the referral deferred until something opens up.

## 2020-01-31 NOTE — Assessment & Plan Note (Signed)
-   Td ~ 2012 at ortho per pt (no documentation) - reluctant to get the covid vaccine, counseled pro> cons  --Prostate cancer screening:  Used to see Dr Risa Grill, has minimal LUTS, DRE today normal, will check a PSA, he also request a UA. - CCS: Cscope 10-2011, 1 polyp, Cscope 2017.  To see his new GI doctor tomorrow, is occasional blood per rectum he thinks related to hemorrhoids - (+) FH CAD at early age,  exercise stress test (-) 2016;  LDL goal close to 100.  Aspirin recommended. -At the end of the visit he requested to see Dr. Johnsie Cancel for further risk stratification, he is asymptomatic, referral sent. -Tobacco: Minimal smoking since last year due to having dental implants -Labs: CMP, FLP, CBC, TSH, PSA, UA, urine culture -Diet and exercise discussed

## 2020-01-31 NOTE — Assessment & Plan Note (Signed)
Here for CPX Occasional paresthesias upper lower extremities, positional (i.e. crossing his legs), no further evaluation needed. Minimal LUTS: See comments under physical exam RTC 1 year

## 2020-02-02 ENCOUNTER — Telehealth: Payer: Self-pay | Admitting: Internal Medicine

## 2020-02-02 NOTE — Telephone Encounter (Signed)
Called patient..  No answer on cell.Marland Kitchen Called home phone s.w wife she stated she will give him the message

## 2020-02-02 NOTE — Telephone Encounter (Signed)
-----   Message from [place], [place] sent at 02/02/2020  9:07 AM EDT ----- Regarding: Appt Pt is wanting to discuss lab results w/ Stanley Petty- can you schedule just a telephone visit at his convenience? If he wants to do today- okay to overbook.   TY. kdc

## 2020-02-09 ENCOUNTER — Encounter: Payer: Self-pay | Admitting: Internal Medicine

## 2020-02-09 NOTE — Telephone Encounter (Signed)
Left message for patient to call back  

## 2020-03-01 NOTE — Telephone Encounter (Signed)
Patient has appointment in July.

## 2020-03-03 ENCOUNTER — Other Ambulatory Visit: Payer: Self-pay | Admitting: Internal Medicine

## 2020-03-04 ENCOUNTER — Telehealth: Payer: Self-pay | Admitting: Internal Medicine

## 2020-03-04 NOTE — Telephone Encounter (Signed)
Patient's Stanley Petty is actually 40 dollars. I contacted the pharmacy and the coupon had not been ran through. Patient's wife indicates that patient still may not want to pay 40 dollars and may prefer to do Miralax prep. I advised that while this can be done, patient would need to come by the office for updated instructions as I need to know that he understands the new instructions and has no questions. Wife verbalizes understanding.

## 2020-03-06 ENCOUNTER — Encounter: Payer: Self-pay | Admitting: Internal Medicine

## 2020-03-11 ENCOUNTER — Other Ambulatory Visit: Payer: Self-pay | Admitting: Internal Medicine

## 2020-03-11 ENCOUNTER — Ambulatory Visit (INDEPENDENT_AMBULATORY_CARE_PROVIDER_SITE_OTHER): Payer: 59

## 2020-03-11 DIAGNOSIS — Z1159 Encounter for screening for other viral diseases: Secondary | ICD-10-CM

## 2020-03-12 LAB — SARS CORONAVIRUS 2 (TAT 6-24 HRS): SARS Coronavirus 2: NEGATIVE

## 2020-03-13 ENCOUNTER — Encounter: Payer: Self-pay | Admitting: Internal Medicine

## 2020-03-13 ENCOUNTER — Ambulatory Visit (AMBULATORY_SURGERY_CENTER): Payer: 59 | Admitting: Internal Medicine

## 2020-03-13 ENCOUNTER — Other Ambulatory Visit: Payer: Self-pay

## 2020-03-13 ENCOUNTER — Telehealth: Payer: Self-pay | Admitting: Internal Medicine

## 2020-03-13 VITALS — BP 93/69 | HR 47 | Temp 96.6°F | Resp 12 | Ht 78.0 in | Wt 166.0 lb

## 2020-03-13 DIAGNOSIS — D122 Benign neoplasm of ascending colon: Secondary | ICD-10-CM

## 2020-03-13 DIAGNOSIS — K2980 Duodenitis without bleeding: Secondary | ICD-10-CM

## 2020-03-13 DIAGNOSIS — K2100 Gastro-esophageal reflux disease with esophagitis, without bleeding: Secondary | ICD-10-CM | POA: Diagnosis not present

## 2020-03-13 DIAGNOSIS — K3189 Other diseases of stomach and duodenum: Secondary | ICD-10-CM | POA: Diagnosis not present

## 2020-03-13 DIAGNOSIS — Z86010 Personal history of colonic polyps: Secondary | ICD-10-CM

## 2020-03-13 DIAGNOSIS — D123 Benign neoplasm of transverse colon: Secondary | ICD-10-CM | POA: Diagnosis not present

## 2020-03-13 DIAGNOSIS — K2950 Unspecified chronic gastritis without bleeding: Secondary | ICD-10-CM | POA: Diagnosis not present

## 2020-03-13 MED ORDER — SODIUM CHLORIDE 0.9 % IV SOLN: 500.0000 mL | INTRAVENOUS | Status: DC

## 2020-03-13 MED ORDER — FAMOTIDINE 20 MG PO TABS
20.0000 mg | ORAL_TABLET | Freq: Two times a day (BID) | ORAL | 0 refills | Status: DC
Start: 2020-03-13 — End: 2020-06-11

## 2020-03-13 NOTE — Patient Instructions (Signed)
Handouts given:  Polyps, hemorrhoids, gastritis Resume previous diet Continue present medications  Await pathology results Start famotadine 20mg  by mouth two times a day 30 min before a meal  YOU HAD AN ENDOSCOPIC PROCEDURE TODAY AT [facility]:   Refer to the procedure report that was given to you for any specific questions about what was found during the examination.  If the procedure report does not answer your questions, please call your gastroenterologist to clarify.  If you requested that your care partner not be given the details of your procedure findings, then the procedure report has been included in a sealed envelope for you to review at your convenience later.  YOU SHOULD EXPECT: Some feelings of bloating in the abdomen. Passage of more gas than usual.  Walking can help get rid of the air that was put into your GI tract during the procedure and reduce the bloating. If you had a lower endoscopy (such as a colonoscopy or flexible sigmoidoscopy) you may notice spotting of blood in your stool or on the toilet paper. If you underwent a bowel prep for your procedure, you may not have a normal bowel movement for a few days.  Please Note:  You might notice some irritation and congestion in your nose or some drainage.  This is from the oxygen used during your procedure.  There is no need for concern and it should clear up in a day or so.  SYMPTOMS TO REPORT IMMEDIATELY:   Following lower endoscopy (colonoscopy or flexible sigmoidoscopy):  Excessive amounts of blood in the stool  Significant tenderness or worsening of abdominal pains  Swelling of the abdomen that is new, acute  Fever of 100F or higher   Following upper endoscopy (EGD)  Vomiting of blood or coffee ground material  New chest pain or pain under the shoulder blades  Painful or persistently difficult swallowing  New shortness of breath  Fever of 100F or higher  Black, tarry-looking stools  For urgent or  emergent issues, a gastroenterologist can be reached at any hour by calling [phone]. Do not use MyChart messaging for urgent concerns.    DIET:  We do recommend a small meal at first, but then you may proceed to your regular diet.  Drink plenty of fluids but you should avoid alcoholic beverages for 24 hours.  ACTIVITY:  You should plan to take it easy for the rest of today and you should NOT DRIVE or use heavy machinery until tomorrow (because of the sedation medicines used during the test).    FOLLOW UP: Our staff will call the number listed on your records 48-72 hours following your procedure to check on you and address any questions or concerns that you may have regarding the information given to you following your procedure. If we do not reach you, we will leave a message.  We will attempt to reach you two times.  During this call, we will ask if you have developed any symptoms of COVID 19. If you develop any symptoms (ie: fever, flu-like symptoms, shortness of breath, cough etc.) before then, please call [phone].  If you test positive for Covid 19 in the 2 weeks post procedure, please call and report this information to [place].    If any biopsies were taken you will be contacted by phone or by letter within the next 1-3 weeks.  Please call us at [phone] if you have not heard about the biopsies in 3 weeks.  SIGNATURES/CONFIDENTIALITY: You and/or your care partner have signed paperwork which will be entered into your electronic medical record.  These signatures attest to the fact that that the information above on your After Visit Summary has been reviewed and is understood.  Full responsibility of the confidentiality of this discharge information lies with you and/or your care-partner.

## 2020-03-13 NOTE — Progress Notes (Signed)
Temp JB  v/s DT

## 2020-03-13 NOTE — Op Note (Signed)
Learned Patient Name: Stanley Petty Procedure Date: 03/13/2020 11:33 AM MRN: [id number] Endoscopist: Jerene Bears , MD Age: 54 Referring MD:  Date of Birth: Feb 18, 1966 Gender: Male Account #: [id number] Procedure:                Colonoscopy Indications:              High risk colon cancer surveillance: Personal                            history of adenoma (10 mm or greater in size), Last                            colonoscopy: August 2017 Medicines:                Monitored Anesthesia Care Procedure:                Pre-Anesthesia Assessment:                           - Prior to the procedure, a History and Physical                            was performed, and patient medications and                            allergies were reviewed. The patient's tolerance of                            previous anesthesia was also reviewed. The risks                            and benefits of the procedure and the sedation                            options and risks were discussed with the patient.                            All questions were answered, and informed consent                            was obtained. Prior Anticoagulants: The patient has                            taken no previous anticoagulant or antiplatelet                            agents. ASA Grade Assessment: II - A patient with                            mild systemic disease. After reviewing the risks                            and benefits, the patient was deemed in  satisfactory condition to undergo the procedure.                           After obtaining informed consent, the colonoscope                            was passed under direct vision. Throughout the                            procedure, the patient's blood pressure, pulse, and                            oxygen saturations were monitored continuously. The                            Colonoscope was introduced through the  anus and                            advanced to the cecum, identified by appendiceal                            orifice and ileocecal valve. The colonoscopy was                            performed without difficulty. The patient tolerated                            the procedure well. The quality of the bowel                            preparation was good. The ileocecal valve,                            appendiceal orifice, and rectum were photographed. Scope In: 11:50:58 AM Scope Out: 12:08:03 PM Scope Withdrawal Time: 0 hours 14 minutes 45 seconds  Total Procedure Duration: 0 hours 17 minutes 5 seconds  Findings:                 The digital rectal exam was normal.                           Two sessile polyps were found in the ascending                            colon. The polyps were 3 to 5 mm in size. These                            polyps were removed with a cold snare. Resection                            and retrieval were complete.                           A 6 mm polyp was found in the transverse  colon. The                            polyp was sessile. The polyp was removed with a                            cold snare. Resection and retrieval were complete.                           Internal hemorrhoids were found during                            retroflexion. The hemorrhoids were medium-sized.                            There was minor distal rectal scarring from                            previous hemorrhoidal banding. Complications:            No immediate complications. Estimated Blood Loss:     Estimated blood loss was minimal. Impression:               - Two 3 to 5 mm polyps in the ascending colon,                            removed with a cold snare. Resected and retrieved.                           - One 6 mm polyp in the transverse colon, removed                            with a cold snare. Resected and retrieved.                           - Internal  hemorrhoids. Recommendation:           - Patient has a contact number available for                            emergencies. The signs and symptoms of potential                            delayed complications were discussed with the                            patient. Return to normal activities tomorrow.                            Written discharge instructions were provided to the                            patient.                           - Resume previous diet.                           -  Continue present medications.                           - Await pathology results.                           - Hemorrhoidal banding can be performed in the                            future if internal hemorrhoids are symptomatic.                           - Repeat colonoscopy is recommended for                            surveillance. The colonoscopy date will be                            determined after pathology results from today's                            exam become available for review. Jerene Bears, MD 03/13/2020 12:17:47 PM This report has been signed electronically.

## 2020-03-13 NOTE — Op Note (Signed)
[facility] Patient Name: Stanley Petty Procedure Date: 03/13/2020 11:34 AM MRN: [id number] Endoscopist: Jerene Bears , MD Age: 54 Referring MD:  Date of Birth: [birth year] Gender: Male Account #: [id number] Procedure:                Upper GI endoscopy Indications:              Gastro-esophageal reflux disease with history of                            esophagitis, family history of esophageal cancer                            (father) Medicines:                Monitored Anesthesia Care Procedure:                Pre-Anesthesia Assessment:                           - Prior to the procedure, a History and Physical                            was performed, and patient medications and                            allergies were reviewed. The patient's tolerance of                            previous anesthesia was also reviewed. The risks                            and benefits of the procedure and the sedation                            options and risks were discussed with the patient.                            All questions were answered, and informed consent                            was obtained. Prior Anticoagulants: The patient has                            taken no previous anticoagulant or antiplatelet                            agents. ASA Grade Assessment: II - A patient with                            mild systemic disease. After reviewing the risks                            and benefits, the patient was deemed in  satisfactory condition to undergo the procedure.                           After obtaining informed consent, the endoscope was                            passed under direct vision. Throughout the                            procedure, the patient's blood pressure, pulse, and                            oxygen saturations were monitored continuously. The                            Endoscope was introduced through the mouth, and                          advanced to the second part of duodenum. The upper                            GI endoscopy was accomplished without difficulty.                            The patient tolerated the procedure well. Scope In: Scope Out: Findings:                 LA Grade A (one or more mucosal breaks less than 5                            mm, not extending between tops of 2 mucosal folds)                            esophagitis with no bleeding was found at the                            gastroesophageal junction.                           The exam of the esophagus was otherwise normal.                           Diffuse moderate inflammation characterized by                            adherent blood, erosions and erythema was found in                            the gastric body and in the gastric antrum.                            Biopsies were taken with a cold forceps for  histology and Helicobacter pylori testing.                           Mild inflammation characterized by erythema was                            found in the duodenal bulb.                           The second portion of the duodenum was normal. Complications:            No immediate complications. Estimated Blood Loss:     Estimated blood loss was minimal. Impression:               - Mild reflux esophagitis with no bleeding.                           - Gastritis. Biopsied.                           - Duodenitis.                           - Normal second portion of the duodenum. Recommendation:           - Patient has a contact number available for                            emergencies. The signs and symptoms of potential                            delayed complications were discussed with the                            patient. Return to normal activities tomorrow.                            Written discharge instructions were provided to the                            patient.                            - Resume previous diet.                           - Continue present medications.                           - Would begin famotidine 20 mg twice daily (given                            previous intolerance to pantoprazole).                           - Await pathology results. Jerene Bears, MD 03/13/2020 12:14:13 PM This report has been signed electronically.

## 2020-03-13 NOTE — Telephone Encounter (Signed)
yes

## 2020-03-13 NOTE — Telephone Encounter (Signed)
Wife wants to know since pt has gastritis that was found on EGD if it is ok for him to take baby asa. Please advise.

## 2020-03-13 NOTE — Progress Notes (Signed)
Called to room to assist during endoscopic procedure.  Patient ID and intended procedure confirmed with present staff. Received instructions for my participation in the procedure from the performing physician.  

## 2020-03-13 NOTE — Telephone Encounter (Signed)
Patient's wife called states his PCP is recommending he start taking aspirin 80mg  daily and they are wanting to confirm that is ok to do

## 2020-03-13 NOTE — Progress Notes (Signed)
To PACU, VSS. Report to [email or web address] 

## 2020-03-14 NOTE — Telephone Encounter (Signed)
Spoke with pt and she is aware.

## 2020-03-15 ENCOUNTER — Telehealth: Payer: Self-pay

## 2020-03-15 ENCOUNTER — Encounter: Payer: Self-pay | Admitting: Internal Medicine

## 2020-03-15 NOTE — Telephone Encounter (Signed)
Would try to stick with the famotidine 20 mg BID for now , symptoms will likely improve Let me know if not

## 2020-03-15 NOTE — Telephone Encounter (Signed)
Contacted patient and relayed Dr. Vena Rua message, and asked him to call back next week if having the same issue.

## 2020-03-15 NOTE — Telephone Encounter (Signed)
  Follow up Call-  Call back number 03/13/2020  Post procedure Call Back phone  # [phone]  Permission to leave phone message Yes  Some recent data might be hidden     Patient questions:  Do you have a fever, pain , or abdominal swelling? Yes Pain Score  0  Bloated and crampy feeling in stomach yesterday afternoon and yesterday evening, and he is questioning the famotidine causing this?  Feeling it the worst 30 minutes after he eats. Has been able to have a bowel movement since the procedures.  Have you tolerated food without any problems? Yes.    Have you been able to return to your normal activities? Yes.    Do you have any questions about your discharge instructions: Diet   No. Medications  No. Follow up visit  No.  Do you have questions or concerns about your Care? No.  Actions: * If pain score is 4 or above: No action needed, pain <4.  Have you developed a fever since your procedure? No 2.   Have you had an respiratory symptoms (SOB or cough) since your procedure? No 3.   Have you tested positive for COVID 19 since your procedure No  4.   Have you had any family members/close contacts diagnosed with the COVID 19 since your procedure?  No  If yes to any of these questions please route to Joylene John, RN and Erenest Rasher, RN

## 2020-03-19 ENCOUNTER — Telehealth: Payer: Self-pay | Admitting: Internal Medicine

## 2020-03-19 ENCOUNTER — Other Ambulatory Visit: Payer: Self-pay

## 2020-03-19 MED ORDER — OMEPRAZOLE 20 MG PO CPDR: 20.0000 mg | DELAYED_RELEASE_CAPSULE | Freq: Every day | ORAL | 3 refills | Status: DC

## 2020-03-19 MED ORDER — OMEPRAZOLE 20 MG PO CPDR
20.0000 mg | DELAYED_RELEASE_CAPSULE | Freq: Every day | ORAL | 3 refills | Status: DC
Start: 2020-03-19 — End: 2020-03-19

## 2020-03-19 NOTE — Telephone Encounter (Signed)
Please seen note below. Pts wife states he cannot take the pepcid, he does not like the way it makes him feel or the side effects. Pt also did not tolerate protonix. Pts wife states years ago he took some sort of med that had a "calming med" in it that helped his stomach and his mood. She wants to know what else he can take. Please advise.

## 2020-03-19 NOTE — Telephone Encounter (Signed)
Pt's wife report that pt is not doing well on famotidine.  She stated that pt is experiencing bloating, weight loss, change in mood and constipation.

## 2020-03-19 NOTE — Telephone Encounter (Signed)
Spoke with pts wife and she is aware. Script sent to pharmacy.

## 2020-03-19 NOTE — Telephone Encounter (Signed)
Patients wife calling to follow up on previous message 

## 2020-03-19 NOTE — Telephone Encounter (Signed)
Okay, discontinue famotidine Could try low-dose omeprazole 20 mg once daily for gastritis

## 2020-04-29 NOTE — Progress Notes (Signed)
CARDIOLOGY CONSULT NOTE       Patient ID: Stanley Petty MRN: [id number] DOB/AGE: [birth year] 54 y.o.  Admit date: (Not on file) Referring Physician: Larose Kells Primary Physician: Colon Branch, MD Primary Cardiologist: New Reason for Consultation: Family history of CAD  Active Problems:   * No active hospital problems. *   HPI:  54 y.o. referred by DR Larose Kells for premature family history of CAD. Both father and paternal uncle had MI/CAD in 45's He is otherwise not on statin, no BP meds and non diabetic Quit smoking August 2020 but still sneaks some when he has beer  Long standing history of GERD/Esophagitis and father with history of esophageal cancer as well  LDL 129 01/29/20   Some tightness in chest that sounds muscular. Not exertional  Discussed taking 81 mg ASA  Discussed risk stratifying in regard to Rx LDL with calcium score   ROS All other systems reviewed and negative except as noted above  Past Medical History:  Diagnosis Date  . Adenomatous colon polyp   . Duodenitis   . Gastritis   . GERD (gastroesophageal reflux disease)   . IBS (irritable bowel syndrome)    Dr Earlean Shawl  . Internal hemorrhoids   . Skin cancer 03/2018   L hand, ?[facility]    Family History  Problem Relation Age of Onset  . Heart disease Father 14       MI, stent age 26  . Prostate cancer Father        dx ~ 61  . Esophageal cancer Father 37  . Hyperlipidemia Sister   . Coronary artery disease Paternal Uncle   . Colon polyps Mother        also gallstones  . Colon cancer Maternal Grandfather   . Diabetes Neg Hx   . Stomach cancer Neg Hx     Social History   Socioeconomic History  . Marital status: Married    Spouse name: Not on file  . Number of children: 2  . Years of education: Not on file  . Highest education level: Not on file  Occupational History  . Occupation: plumbing, has his own co  Tobacco Use  . Smoking status: Former Smoker    Packs/day: 1.00    Types: Cigarettes    Quit date:  05/31/2019    Years since quitting: 0.9  . Smokeless tobacco: Never Used  . Tobacco comment: minimal tobacco since ~ 05/2020  Vaping Use  . Vaping Use: Never used  Substance and Sexual Activity  . Alcohol use: Yes    Alcohol/week: 0.0 standard drinks    Comment: socially   . Drug use: No  . Sexual activity: Not on file  Other Topics Concern  . Not on file  Social History Narrative   2 children: 1987, 2008   Social Determinants of Radio broadcast assistant Strain:   . Difficulty of Paying Living Expenses:   Food Insecurity:   . Worried About Charity fundraiser in the Last Year:   . Arboriculturist in the Last Year:   Transportation Needs:   . Film/video editor (Medical):   Marland Kitchen Lack of Transportation (Non-Medical):   Physical Activity:   . Days of Exercise per Week:   . Minutes of Exercise per Session:   Stress:   . Feeling of Stress :   Social Connections:   . Frequency of Communication with Friends and Family:   . Frequency of Social Gatherings with Friends and  Family:   . Attends Religious Services:   . Active Member of Clubs or Organizations:   . Attends Archivist Meetings:   Marland Kitchen Marital Status:   Intimate Partner Violence:   . Fear of Current or Ex-Partner:   . Emotionally Abused:   Marland Kitchen Physically Abused:   . Sexually Abused:     Past Surgical History:  Procedure Laterality Date  . CHOLECYSTECTOMY N/A 01/15/2017   Procedure: LAPAROSCOPIC CHOLECYSTECTOMY;  Surgeon: Ralene Ok, MD;  Location: [facility];  Service: General;  Laterality: N/A;  . COLONOSCOPY    . CYSTOSCOPY     Dr Risa Grill  . HEMORRHOID BANDING     [facility]   . hemorrhoidal banding    . MULTIPLE TOOTH EXTRACTIONS    . SURGERY ON TESTICLE     at age 53      Current Outpatient Medications:  .  aspirin EC 81 MG tablet, Take 81 mg by mouth daily., Disp: , Rfl:  .  famotidine (PEPCID) 20 MG tablet, Take 1 tablet (20 mg total) by mouth 2 (two) times daily., Disp: 90 tablet, Rfl:  0    Physical Exam: Blood pressure 128/86, pulse (!) 50, height 6\' 4"  (1.93 m), weight 163 lb (73.9 kg), SpO2 99 %.    Affect appropriate Healthy:  appears stated age 15: normal Neck supple with no adenopathy JVP normal no bruits no thyromegaly Lungs clear with no wheezing and good diaphragmatic motion Heart:  S1/S2 no murmur, no rub, gallop or click PMI normal Abdomen: benighn, BS positve, no tenderness, no AAA no bruit.  No HSM or HJR post cholecystectomy  Distal pulses intact with no bruits No edema Neuro non-focal Skin warm and dry No muscular weakness   Labs:   Lab Results  Component Value Date   WBC 8.1 01/29/2020   HGB 15.1 01/29/2020   HCT 44.8 01/29/2020   MCV 94.6 01/29/2020   PLT 235.0 01/29/2020   No results for input(s): NA, K, CL, CO2, BUN, CREATININE, CALCIUM, PROT, BILITOT, ALKPHOS, ALT, AST, GLUCOSE in the last 168 hours.  Invalid input(s): LABALBU Lab Results  Component Value Date   TROPONINI <0.01 06/15/2016    Lab Results  Component Value Date   CHOL 194 01/29/2020   CHOL 186 04/22/2018   CHOL 182 02/17/2016   Lab Results  Component Value Date   HDL 45.30 01/29/2020   HDL 43.70 04/22/2018   HDL 40.80 02/17/2016   Lab Results  Component Value Date   LDLCALC 129 (H) 01/29/2020   LDLCALC 130 (H) 04/22/2018   LDLCALC 121 (H) 02/17/2016   Lab Results  Component Value Date   TRIG 95.0 01/29/2020   TRIG 62.0 04/22/2018   TRIG 100.0 02/17/2016   Lab Results  Component Value Date   CHOLHDL 4 01/29/2020   CHOLHDL 4 04/22/2018   CHOLHDL 4 02/17/2016   No results found for: LDLDIRECT    Radiology: No results found.  EKG: 2018 SB rate 50 hyperacute T waves anteriorly  04/30/20 SR Rate 50 normal    ASSESSMENT AND PLAN:   1. Family History Premature CAD:  Discussed utility of coronary calcium score to further risk stratify If calcium score above average for age start on statin given LDL > 70  ETT since ECG is normal   2. GERD:   Low carb diet continue pepcid/prilosec f/u Dr Hilarie Fredrickson   Signed: Jenkins Rouge 04/30/2020, 9:40 AM

## 2020-04-30 ENCOUNTER — Encounter: Payer: Self-pay | Admitting: Cardiovascular Disease

## 2020-04-30 ENCOUNTER — Ambulatory Visit (INDEPENDENT_AMBULATORY_CARE_PROVIDER_SITE_OTHER): Payer: 59 | Admitting: Cardiovascular Disease

## 2020-04-30 ENCOUNTER — Other Ambulatory Visit: Payer: Self-pay

## 2020-04-30 VITALS — BP 128/86 | HR 50 | Ht 76.0 in | Wt 163.0 lb

## 2020-04-30 DIAGNOSIS — Z8249 Family history of ischemic heart disease and other diseases of the circulatory system: Secondary | ICD-10-CM | POA: Diagnosis not present

## 2020-04-30 DIAGNOSIS — K219 Gastro-esophageal reflux disease without esophagitis: Secondary | ICD-10-CM | POA: Diagnosis not present

## 2020-04-30 NOTE — Patient Instructions (Addendum)
Medication Instructions:  *If you need a refill on your cardiac medications before your next appointment, please call your pharmacy*  Lab Work: If you have labs (blood work) drawn today and your tests are completely normal, you will receive your results only by:  Stanley Petty (if you have MyChart) OR  A paper copy in the mail If you have any lab test that is abnormal or we need to change your treatment, we will call you to review the results.  Testing/Procedures: Your physician has requested that you have an exercise tolerance test. For further information please visit [email or web address]. Please also follow instruction sheet, as given.  Cardiac CT scanning for calcium score. (CAT scanning), is a noninvasive, special x-ray that produces cross-sectional images of the body using x-rays and a computer. CT scans help physicians diagnose and treat medical conditions. For some CT exams, a contrast material is used to enhance visibility in the area of the body being studied. CT scans provide greater clarity and reveal more details than regular x-ray exams.  Follow-Up: At [facility], you and your health needs are our priority.  As part of our continuing mission to provide you with exceptional heart care, we have created designated Provider Care Teams.  These Care Teams include your primary Cardiologist (physician) and Advanced Practice Providers (APPs -  Physician Assistants and Nurse Practitioners) who all work together to provide you with the care you need, when you need it.  We recommend signing up for the patient portal called "MyChart".  Sign up information is provided on this After Visit Summary.  MyChart is used to connect with patients for Virtual Visits (Telemedicine).  Patients are able to view lab/test results, encounter notes, upcoming appointments, etc.  Non-urgent messages can be sent to your provider as well.   To learn more about what you can do with MyChart, go to  [email or web address].    Your next appointment:   12 month(s)  The format for your next appointment:   In Person  Provider:   You may see Dr. Johnsie Cancel or one of the following Advanced Practice Providers on your designated Care Team:    Truitt Merle, NP  Cecilie Kicks, NP  Kathyrn Drown, NP

## 2020-05-21 ENCOUNTER — Other Ambulatory Visit: Payer: Self-pay

## 2020-05-21 ENCOUNTER — Telehealth: Payer: Self-pay

## 2020-05-21 ENCOUNTER — Ambulatory Visit (INDEPENDENT_AMBULATORY_CARE_PROVIDER_SITE_OTHER): Payer: 59

## 2020-05-21 ENCOUNTER — Ambulatory Visit (INDEPENDENT_AMBULATORY_CARE_PROVIDER_SITE_OTHER)
Admission: RE | Admit: 2020-05-21 | Discharge: 2020-05-21 | Disposition: A | Discharge status: Home / Self Care | Payer: Self-pay | Source: Ambulatory Visit | Attending: Cardiovascular Disease | Admitting: Cardiovascular Disease

## 2020-05-21 DIAGNOSIS — E785 Hyperlipidemia, unspecified: Secondary | ICD-10-CM

## 2020-05-21 DIAGNOSIS — Z8249 Family history of ischemic heart disease and other diseases of the circulatory system: Secondary | ICD-10-CM | POA: Diagnosis not present

## 2020-05-21 LAB — EXERCISE TOLERANCE TEST
Estimated workload: 13.4 METS
Exercise duration (min): 11 min
Exercise duration (sec): 11 s
MPHR: 164 {beats}/min
Peak HR: 162 {beats}/min
Percent HR: 98 %
RPE: 17
Rest HR: 56 {beats}/min

## 2020-05-21 MED ORDER — ATORVASTATIN CALCIUM 20 MG PO TABS
20.0000 mg | ORAL_TABLET | Freq: Every day | ORAL | 3 refills | Status: DC
Start: 2020-05-21 — End: 2021-10-03

## 2020-05-21 NOTE — Telephone Encounter (Signed)
-----   Message from Josue Hector, MD sent at 05/21/2020  2:08 PM EDT ----- Calcium score very high for age 54, 21 st percentile LDL 129 should start lipitor 20 mg daily and f/u labs in 3 months will see how ETT looks

## 2020-05-21 NOTE — Telephone Encounter (Signed)
Called patient with CT results. Per Dr. Johnsie Cancel, Calcium score very high for age 54, 30 st percentile LDL 129 should start lipitor 20 mg daily and f/u labs in 3 months will see how ETT looks. Patient with come back for lab work on 08/21/20.

## 2020-06-11 ENCOUNTER — Encounter: Payer: Self-pay | Admitting: Internal Medicine

## 2020-06-11 ENCOUNTER — Ambulatory Visit: Payer: 59 | Admitting: Internal Medicine

## 2020-06-11 ENCOUNTER — Other Ambulatory Visit: Payer: Self-pay

## 2020-06-11 VITALS — BP 129/83 | HR 50 | Temp 97.9°F | Resp 16 | Ht 76.0 in | Wt 160.2 lb

## 2020-06-11 DIAGNOSIS — M791 Myalgia, unspecified site: Secondary | ICD-10-CM

## 2020-06-11 DIAGNOSIS — E7800 Pure hypercholesterolemia, unspecified: Secondary | ICD-10-CM

## 2020-06-11 LAB — CBC WITH DIFFERENTIAL/PLATELET
Basophils Absolute: 0.1 10*3/uL (ref 0.0–0.1)
Basophils Relative: 0.9 % (ref 0.0–3.0)
Eosinophils Absolute: 0.2 10*3/uL (ref 0.0–0.7)
Eosinophils Relative: 2.8 % (ref 0.0–5.0)
HCT: 46.5 % (ref 39.0–52.0)
Hemoglobin: 15.5 g/dL (ref 13.0–17.0)
Lymphocytes Relative: 35.3 % (ref 12.0–46.0)
Lymphs Abs: 3 10*3/uL (ref 0.7–4.0)
MCHC: 33.4 g/dL (ref 30.0–36.0)
MCV: 94.5 fl (ref 78.0–100.0)
Monocytes Absolute: 0.7 10*3/uL (ref 0.1–1.0)
Monocytes Relative: 8 % (ref 3.0–12.0)
Neutro Abs: 4.5 10*3/uL (ref 1.4–7.7)
Neutrophils Relative %: 53 % (ref 43.0–77.0)
Platelets: 228 10*3/uL (ref 150.0–400.0)
RBC: 4.93 Mil/uL (ref 4.22–5.81)
RDW: 13.3 % (ref 11.5–15.5)
WBC: 8.5 10*3/uL (ref 4.0–10.5)

## 2020-06-11 LAB — COMPREHENSIVE METABOLIC PANEL
ALT: 29 U/L (ref 0–53)
AST: 23 U/L (ref 0–37)
Albumin: 4.4 g/dL (ref 3.5–5.2)
Alkaline Phosphatase: 87 U/L (ref 39–117)
BUN: 15 mg/dL (ref 6–23)
CO2: 28 mEq/L (ref 19–32)
Calcium: 9.4 mg/dL (ref 8.4–10.5)
Chloride: 103 mEq/L (ref 96–112)
Creatinine, Ser: 0.94 mg/dL (ref 0.40–1.50)
GFR: 82.85 mL/min (ref >60.00)
Glucose, Bld: 77 mg/dL (ref 70–99)
Potassium: 4.7 mEq/L (ref 3.5–5.1)
Sodium: 138 mEq/L (ref 135–145)
Total Bilirubin: 0.6 mg/dL (ref 0.2–1.2)
Total Protein: 6.8 g/dL (ref 6.0–8.3)

## 2020-06-11 LAB — CK: Total CK: 88 U/L (ref 7–232)

## 2020-06-11 LAB — SEDIMENTATION RATE: Sed Rate: 6 mm/hr (ref 0–20)

## 2020-06-11 NOTE — Patient Instructions (Signed)
   GO TO THE LAB : Get the blood work    We will call you with the results as soon as possible.  If you have fever, chills, severe headaches or difficulty with your vision please let me know

## 2020-06-11 NOTE — Progress Notes (Signed)
Subjective:    Patient ID: Stanley Petty, male    DOB: 1966-10-13, 54 y.o.   MRN: [id number]  DOS:  06/11/2020 Type of visit - description: Acute  Approximately 05/14/2020 he started to develop pain: Described as a soreness both shoulders and arms. Symptoms are mostly at night and sometimes positional. They never go away but feel better when he is active at work. Prior to the onset of pain he did some heavy lifting. Arm and forearm pain increases when he move his fingers.  Approximately a week after the onset of symptoms he started Lipitor.  Wt Readings from Last 3 Encounters:  06/11/20 160 lb 4 oz (72.7 kg)  04/30/20 163 lb (73.9 kg)  03/13/20 166 lb (75.3 kg)     Review of Systems Other than the elbows aching no arthralgias.  Denies fever chills No weight loss No visual changes No neck pain No upper or lower extremity tingling, no gait disorder. No headaches. Admits to multiple tick bites No rash  Past Medical History:  Diagnosis Date  . Adenomatous colon polyp   . Duodenitis   . Gastritis   . GERD (gastroesophageal reflux disease)   . IBS (irritable bowel syndrome)    Dr Earlean Shawl  . Internal hemorrhoids   . Skin cancer 03/2018   L hand, ?[place]    Past Surgical History:  Procedure Laterality Date  . CHOLECYSTECTOMY N/A 01/15/2017   Procedure: LAPAROSCOPIC CHOLECYSTECTOMY;  Surgeon: Ralene Ok, MD;  Location: [facility];  Service: General;  Laterality: N/A;  . COLONOSCOPY    . CYSTOSCOPY     Dr Risa Grill  . HEMORRHOID BANDING     [facility]   . hemorrhoidal banding    . MULTIPLE TOOTH EXTRACTIONS    . SURGERY ON TESTICLE     at age 66    Allergies as of 06/11/2020      Reactions   Vicodin [hydrocodone-acetaminophen] Nausea And Vomiting   Pt states when takes whole pill has nausea and vomiting but has taken 1/2 tab without difficulty       Medication List       Accurate as of June 11, 2020 11:59 PM. If you have any questions, ask your nurse or doctor.         STOP taking these medications   famotidine 20 MG tablet Commonly known as: PEPCID Stopped by: Kathlene November, MD     TAKE these medications   aspirin EC 81 MG tablet Take 81 mg by mouth daily.   atorvastatin 20 MG tablet Commonly known as: LIPITOR Take 1 tablet (20 mg total) by mouth daily.          Objective:   Physical Exam BP 129/83 (BP Location: Left Arm, Patient Position: Sitting, Cuff Size: Small)   Pulse (!) 50   Temp 97.9 F (36.6 C) (Oral)   Resp 16   Ht 6\' 4"  (1.93 m)   Wt 160 lb 4 oz (72.7 kg)   SpO2 100%   BMI 19.51 kg/m  General:   Well developed, NAD, BMI noted. HEENT:  Normocephalic . Face symmetric, atraumatic Neck: Full range of motion, no TTP at the cervical spine. Lungs:  CTA B Normal respiratory effort, no intercostal retractions, no accessory muscle use. Heart: RRR,  no murmur.  Lower extremities: no pretibial edema bilaterally  Skin: No rash MSK: Hands, wrists, elbows with no synovitis Neurologic:  alert & oriented X3.  Speech normal, gait appropriate for age and unassisted Psych--  Cognition and  judgment appear intact.  Cooperative with normal attention span and concentration.  Behavior appropriate. No anxious or depressed appearing.      Assessment     Assessment IBS  Hemorrhoids, h/o banding Skin cancer , [facility]? L hand 03/2018 Exercise tolerance test normal 10-2014 2012: Abdominal pain, [place] 2 liver lesions; HIDA: decreased EF   PLAN Myalgias: Myalgias localized at the shoulders (bilateral upper trapezoids) and upper extremities started shortly after he did some heavy lifting and a week before he started Lipitor. History of tick bites. Etiology unclear, although symptoms preceded Lipitor, statins may still playing a role on his pain. Plan:  Check a CMP, CBC, sed rate, total CK and Lyme serology. Further advised with results Dyslipidemia: High risk for CAD due to [place] and high coronary calcium score.  On Lipitor.   This visit  occurred during the SARS-CoV-2 public health emergency.  Safety protocols were in place, including screening questions prior to the visit, additional usage of staff PPE, and extensive cleaning of exam room while observing appropriate contact time as indicated for disinfecting solutions.

## 2020-06-11 NOTE — Progress Notes (Signed)
Pre visit review using our clinic review tool, if applicable. No additional management support is needed unless otherwise documented below in the visit note. 

## 2020-06-12 LAB — B. BURGDORFI ANTIBODIES: B burgdorferi Ab IgG+IgM: <0.9 {index}

## 2020-06-12 NOTE — Assessment & Plan Note (Signed)
Myalgias: Myalgias localized at the shoulders (bilateral upper trapezoids) and upper extremities started shortly after he did some heavy lifting and a week before he started Lipitor. History of tick bites. Etiology unclear, although symptoms preceded Lipitor, statins may still playing a role on his pain. Plan:  Check a CMP, CBC, sed rate, total CK and Lyme serology. Further advised with results Dyslipidemia: High risk for CAD due to [place] and high coronary calcium score.  On Lipitor.

## 2020-06-27 ENCOUNTER — Ambulatory Visit (INDEPENDENT_AMBULATORY_CARE_PROVIDER_SITE_OTHER): Payer: 59 | Admitting: Family Medicine

## 2020-06-27 ENCOUNTER — Ambulatory Visit (INDEPENDENT_AMBULATORY_CARE_PROVIDER_SITE_OTHER): Payer: 59

## 2020-06-27 ENCOUNTER — Encounter: Payer: Self-pay | Admitting: Family Medicine

## 2020-06-27 ENCOUNTER — Other Ambulatory Visit: Payer: Self-pay

## 2020-06-27 VITALS — BP 118/74 | HR 85 | Temp 98.1°F | Ht 76.0 in | Wt 161.2 lb

## 2020-06-27 DIAGNOSIS — R222 Localized swelling, mass and lump, trunk: Secondary | ICD-10-CM

## 2020-06-27 NOTE — Progress Notes (Signed)
Established Patient Office Visit  Subjective:  Patient ID: Stanley Petty, male    DOB: 06/10/1966  Age: 54 y.o. MRN: [id number]  CC:  Chief Complaint  Patient presents with  . Pain    pain in right shoulder, and right side of neck, little knot on left side of clavical area. Muscles feel tight in forearms.     HPI Stanley Petty presents for evaluation of a mass in his anterior right upper chest adjacent to the clavicle.  Believes that this may have come about after sleeping in a tent over that shoulder.  Stanley Petty is concerned about the possibility of an abscess.  Stanley Petty feels like it is new.  Stanley Petty also feels as though his right is asymmetric to the left clavicle.  Under evaluation for myalgias that started prior to onset of statin therapy for a high calcium score and heavy smoking history.  Extensive blood work up was essentially negative.  Above-mentioned mass is not tender and has not drained.  Clavicle is nontender.  Denies past history of injury to clavicle.  Stanley Petty feels as though the mass is no.  Past Medical History:  Diagnosis Date  . Adenomatous colon polyp   . Duodenitis   . Gastritis   . GERD (gastroesophageal reflux disease)   . IBS (irritable bowel syndrome)    Dr Earlean Shawl  . Internal hemorrhoids   . Skin cancer 03/2018   L hand, ?[place]    Past Surgical History:  Procedure Laterality Date  . CHOLECYSTECTOMY N/A 01/15/2017   Procedure: LAPAROSCOPIC CHOLECYSTECTOMY;  Surgeon: Ralene Ok, MD;  Location: [facility];  Service: General;  Laterality: N/A;  . COLONOSCOPY    . CYSTOSCOPY     Dr Risa Grill  . HEMORRHOID BANDING     [facility]   . hemorrhoidal banding    . MULTIPLE TOOTH EXTRACTIONS    . SURGERY ON TESTICLE     at age 87    Family History  Problem Relation Age of Onset  . Heart disease Father 39       MI, stent age 4  . Prostate cancer Father        dx ~ 18  . Esophageal cancer Father 64  . Hyperlipidemia Sister   . Coronary artery disease Paternal Uncle   . Colon  polyps Mother        also gallstones  . Colon cancer Maternal Grandfather   . Diabetes Neg Hx   . Stomach cancer Neg Hx     Social History   Socioeconomic History  . Marital status: Married    Spouse name: Not on file  . Number of children: 2  . Years of education: Not on file  . Highest education level: Not on file  Occupational History  . Occupation: plumbing, has his own co  Tobacco Use  . Smoking status: Former Smoker    Packs/day: 1.00    Types: Cigarettes    Quit date: 05/31/2019    Years since quitting: 1.0  . Smokeless tobacco: Never Used  . Tobacco comment: minimal tobacco since ~ 05/2020  Vaping Use  . Vaping Use: Never used  Substance and Sexual Activity  . Alcohol use: Yes    Alcohol/week: 0.0 standard drinks    Comment: socially   . Drug use: No  . Sexual activity: Not on file  Other Topics Concern  . Not on file  Social History Narrative   2 children: 1987, 2008   Social Determinants of Health  Financial Resource Strain:   . Difficulty of Paying Living Expenses: Not on file  Food Insecurity:   . Worried About Charity fundraiser in the Last Year: Not on file  . Ran Out of Food in the Last Year: Not on file  Transportation Needs:   . Lack of Transportation (Medical): Not on file  . Lack of Transportation (Non-Medical): Not on file  Physical Activity:   . Days of Exercise per Week: Not on file  . Minutes of Exercise per Session: Not on file  Stress:   . Feeling of Stress : Not on file  Social Connections:   . Frequency of Communication with Friends and Family: Not on file  . Frequency of Social Gatherings with Friends and Family: Not on file  . Attends Religious Services: Not on file  . Active Member of Clubs or Organizations: Not on file  . Attends Archivist Meetings: Not on file  . Marital Status: Not on file  Intimate Partner Violence:   . Fear of Current or Ex-Partner: Not on file  . Emotionally Abused: Not on file  . Physically  Abused: Not on file  . Sexually Abused: Not on file    Outpatient Medications Prior to Visit  Medication Sig Dispense Refill  . aspirin EC 81 MG tablet Take 81 mg by mouth daily.    Marland Kitchen atorvastatin (LIPITOR) 20 MG tablet Take 1 tablet (20 mg total) by mouth daily. 90 tablet 3   No facility-administered medications prior to visit.    Allergies  Allergen Reactions  . Vicodin [Hydrocodone-Acetaminophen] Nausea And Vomiting    Pt states when takes whole pill has nausea and vomiting but has taken 1/2 tab without difficulty     ROS Review of Systems  Constitutional: Negative.   Respiratory: Negative.   Cardiovascular: Negative.   Gastrointestinal: Negative.   Genitourinary: Negative.   Musculoskeletal: Negative for neck pain and neck stiffness.  Neurological: Negative for weakness and numbness.  Psychiatric/Behavioral: Negative.       Objective:    Physical Exam Vitals and nursing note reviewed.  Constitutional:      General: Stanley Petty is not in acute distress.    Appearance: Normal appearance. Stanley Petty is normal weight. Stanley Petty is not ill-appearing, toxic-appearing or diaphoretic.  HENT:     Head: Normocephalic and atraumatic.     Right Ear: External ear normal.     Left Ear: External ear normal.  Eyes:     General: No scleral icterus.       Right eye: No discharge.        Left eye: No discharge.     Conjunctiva/sclera: Conjunctivae normal.  Pulmonary:     Effort: Pulmonary effort is normal.  Chest:    Musculoskeletal:     Cervical back: No swelling, edema, deformity, erythema, signs of trauma, lacerations, rigidity, spasms, torticollis, tenderness or bony tenderness. Normal range of motion.       Back:  Lymphadenopathy:     Cervical:     Right cervical: No superficial or deep cervical adenopathy.    Left cervical: No superficial or deep cervical adenopathy.     Upper Body:     Right upper body: No supraclavicular adenopathy.     Left upper body: No supraclavicular adenopathy.   Skin:    General: Skin is warm and dry.  Neurological:     Mental Status: Stanley Petty is alert and oriented to person, place, and time.  Psychiatric:  Mood and Affect: Mood normal.        Behavior: Behavior normal.     BP 118/74   Pulse 85   Temp 98.1 F (36.7 C) (Tympanic)   Ht 6\' 4"  (1.93 m)   Wt 161 lb 3.2 oz (73.1 kg)   SpO2 97%   BMI 19.62 kg/m  Wt Readings from Last 3 Encounters:  06/27/20 161 lb 3.2 oz (73.1 kg)  06/11/20 160 lb 4 oz (72.7 kg)  04/30/20 163 lb (73.9 kg)     Health Maintenance Due  Topic Date Due  . COVID-19 Vaccine (1) Never done    There are no preventive care reminders to display for this patient.  Lab Results  Component Value Date   TSH 0.49 01/29/2020   Lab Results  Component Value Date   WBC 8.5 06/11/2020   HGB 15.5 06/11/2020   HCT 46.5 06/11/2020   MCV 94.5 06/11/2020   PLT 228.0 06/11/2020   Lab Results  Component Value Date   NA 138 06/11/2020   K 4.7 06/11/2020   CO2 28 06/11/2020   GLUCOSE 77 06/11/2020   BUN 15 06/11/2020   CREATININE 0.94 06/11/2020   BILITOT 0.6 06/11/2020   ALKPHOS 87 06/11/2020   AST 23 06/11/2020   ALT 29 06/11/2020   PROT 6.8 06/11/2020   ALBUMIN 4.4 06/11/2020   CALCIUM 9.4 06/11/2020   ANIONGAP 8 01/13/2017   GFR 82.85 06/11/2020   Lab Results  Component Value Date   CHOL 194 01/29/2020   Lab Results  Component Value Date   HDL 45.30 01/29/2020   Lab Results  Component Value Date   LDLCALC 129 (H) 01/29/2020   Lab Results  Component Value Date   TRIG 95.0 01/29/2020   Lab Results  Component Value Date   CHOLHDL 4 01/29/2020   No results found for: HGBA1C    Assessment & Plan:   Problem List Items Addressed This Visit    None    Visit Diagnoses    Mass of right chest wall    -  Primary   Relevant Orders   Ambulatory referral to General Surgery   DG Clavicle Right      No orders of the defined types were placed in this encounter.   Follow-up: Return Follow  up with Dr. Larose Kells..  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  Patient feels as though it is new.  Went ahead with surgical referral for second opinion and hopeful reassurance.  Do not believe that patient's symptoms are from statin therapy.  Stanley Petty will follow-up with Dr.Paz.   Libby Maw, MD

## 2020-08-21 ENCOUNTER — Other Ambulatory Visit: Payer: 59 | Admitting: *Deleted

## 2020-08-21 ENCOUNTER — Other Ambulatory Visit: Payer: Self-pay

## 2020-08-21 DIAGNOSIS — E785 Hyperlipidemia, unspecified: Secondary | ICD-10-CM

## 2020-08-21 LAB — HEPATIC FUNCTION PANEL
ALT: 28 IU/L (ref 0–44)
AST: 22 IU/L (ref 0–40)
Albumin: 4.3 g/dL (ref 3.8–4.9)
Alkaline Phosphatase: 100 IU/L (ref 44–121)
Bilirubin Total: 0.4 mg/dL (ref 0.0–1.2)
Bilirubin, Direct: 0.16 mg/dL (ref 0.00–0.40)
Total Protein: 6.5 g/dL (ref 6.0–8.5)

## 2020-08-21 LAB — LIPID PANEL
Chol/HDL Ratio: 2.6 ratio (ref 0.0–5.0)
Cholesterol, Total: 127 mg/dL (ref 100–199)
HDL: 49 mg/dL (ref >39)
LDL Chol Calc (NIH): 66 mg/dL (ref 0–99)
Triglycerides: 53 mg/dL (ref 0–149)
VLDL Cholesterol Cal: 12 mg/dL (ref 5–40)

## 2020-08-22 ENCOUNTER — Telehealth: Payer: Self-pay

## 2020-08-22 DIAGNOSIS — E785 Hyperlipidemia, unspecified: Secondary | ICD-10-CM

## 2020-08-22 NOTE — Telephone Encounter (Signed)
The patient has been notified of the result and verbalized understanding.  All questions (if any) were answered. Michaelyn Barter, RN 08/22/2020 9:07 AM   Patient will come in on 02/20/20 for repeat lab work.

## 2020-08-22 NOTE — Telephone Encounter (Signed)
-----   Message from Josue Hector, MD sent at 08/21/2020  4:56 PM EDT ----- Cholesterol is at goal and LFT's are normal F/U labs in 6 months

## 2020-09-10 ENCOUNTER — Ambulatory Visit (INDEPENDENT_AMBULATORY_CARE_PROVIDER_SITE_OTHER): Payer: 59 | Admitting: Family

## 2020-09-10 ENCOUNTER — Ambulatory Visit (HOSPITAL_BASED_OUTPATIENT_CLINIC_OR_DEPARTMENT_OTHER)
Admission: RE | Admit: 2020-09-10 | Discharge: 2020-09-10 | Disposition: A | Discharge status: Home / Self Care | Payer: 59 | Source: Ambulatory Visit | Attending: Family | Admitting: Family

## 2020-09-10 ENCOUNTER — Other Ambulatory Visit: Payer: Self-pay

## 2020-09-10 VITALS — BP 108/69 | HR 60 | Temp 98.4°F | Resp 16 | Ht 76.0 in | Wt 168.0 lb

## 2020-09-10 DIAGNOSIS — R0781 Pleurodynia: Secondary | ICD-10-CM

## 2020-09-10 DIAGNOSIS — Z7185 Encounter for immunization safety counseling: Secondary | ICD-10-CM

## 2020-09-10 NOTE — Progress Notes (Signed)
Subjective:    Patient ID: Stanley Petty, male    DOB: Mar 23, 1966, 54 y.o.   MRN: [id number]  HPI  Patient is a 54 yr old male who presents today with chief complaint of left sided rib pain. Reports that he was laying on his left side earlier today on the concrete reaching down into a hole to make a plumbing connection and he felt a pop on the left lower rib area.  He then had associated pain and was unable to complete his job due to the pain. Does not hurt to take a deep breath.  He does have reproducible left rib pain.     Review of Systems See HPI  Past Medical History:  Diagnosis Date   Adenomatous colon polyp    Duodenitis    Gastritis    GERD (gastroesophageal reflux disease)    IBS (irritable bowel syndrome)    Dr Earlean Shawl   Internal hemorrhoids    Skin cancer 03/2018   L hand, ?[facility]     Social History   Socioeconomic History   Marital status: Married    Spouse name: Not on file   Number of children: 2   Years of education: Not on file   Highest education level: Not on file  Occupational History   Occupation: plumbing, has his own co  Tobacco Use   Smoking status: Former Smoker    Packs/day: 1.00    Types: Cigarettes    Quit date: 05/31/2019    Years since quitting: 1.2   Smokeless tobacco: Never Used   Tobacco comment: minimal tobacco since ~ 05/2020  Vaping Use   Vaping Use: Never used  Substance and Sexual Activity   Alcohol use: Yes    Alcohol/week: 0.0 standard drinks    Comment: socially    Drug use: No   Sexual activity: Not on file  Other Topics Concern   Not on file  Social History Narrative   2 children: 1987, 2008   Social Determinants of Radio broadcast assistant Strain:    Difficulty of Paying Living Expenses: Not on file  Food Insecurity:    Worried About Charity fundraiser in the Last Year: Not on file   [employer] of Food in the Last Year: Not on file  Transportation Needs:    Lack of Transportation  (Medical): Not on file   Lack of Transportation (Non-Medical): Not on file  Physical Activity:    Days of Exercise per Week: Not on file   Minutes of Exercise per Session: Not on file  Stress:    Feeling of Stress : Not on file  Social Connections:    Frequency of Communication with Friends and Family: Not on file   Frequency of Social Gatherings with Friends and Family: Not on file   Attends Religious Services: Not on file   Active Member of Clubs or Organizations: Not on file   Attends Archivist Meetings: Not on file   Marital Status: Not on file  Intimate Partner Violence:    Fear of Current or Ex-Partner: Not on file   Emotionally Abused: Not on file   Physically Abused: Not on file   Sexually Abused: Not on file    Past Surgical History:  Procedure Laterality Date   CHOLECYSTECTOMY N/A 01/15/2017   Procedure: LAPAROSCOPIC CHOLECYSTECTOMY;  Surgeon: Ralene Ok, MD;  Location: [facility];  Service: General;  Laterality: N/A;   COLONOSCOPY     CYSTOSCOPY  Dr Risa Grill   HEMORRHOID BANDING     [facility]    hemorrhoidal banding     MULTIPLE TOOTH EXTRACTIONS     SURGERY ON TESTICLE     at age 71    Family History  Problem Relation Age of Onset   Heart disease Father 75       MI, stent age 2   Prostate cancer Father        dx ~ 7   Esophageal cancer Father 31   Hyperlipidemia Sister    Coronary artery disease Paternal Uncle    Colon polyps Mother        also gallstones   Colon cancer Maternal Grandfather    Diabetes Neg Hx    Stomach cancer Neg Hx     Allergies  Allergen Reactions   Vicodin [Hydrocodone-Acetaminophen] Nausea And Vomiting    Pt states when takes whole pill has nausea and vomiting but has taken 1/2 tab without difficulty     Current Outpatient Medications on File Prior to Visit  Medication Sig Dispense Refill   aspirin EC 81 MG tablet Take 81 mg by mouth daily.     atorvastatin (LIPITOR) 20 MG tablet  Take 1 tablet (20 mg total) by mouth daily. 90 tablet 3   No current facility-administered medications on file prior to visit.    BP 108/69 (BP Location: Right Arm, Patient Position: Sitting, Cuff Size: Small)    Pulse 60    Temp 98.4 F (36.9 C) (Oral)    Resp 16    Ht 6\' 4"  (1.93 m)    Wt 168 lb (76.2 kg)    SpO2 100%    BMI 20.45 kg/m       Objective:   Physical Exam Constitutional:      General: He is not in acute distress.    Appearance: He is well-developed.  HENT:     Head: Normocephalic and atraumatic.  Cardiovascular:     Rate and Rhythm: Normal rate and regular rhythm.     Heart sounds: No murmur heard.   Pulmonary:     Effort: Pulmonary effort is normal. No respiratory distress.     Breath sounds: Normal breath sounds. No wheezing or rales.  Musculoskeletal:     Comments: Reproducible tenderness at the base of the left anterior 9th rib.    Skin:    General: Skin is warm and dry.  Neurological:     Mental Status: He is alert and oriented to person, place, and time.  Psychiatric:        Behavior: Behavior normal.        Thought Content: Thought content normal.           Assessment & Plan:  Immunization counseling- Recommended flu shot, tetanus booster and Covid vaccine- pt declines all of these vaccines.  Rib pain- ? Rib fracture. Will obtain rib x-ray.  Pt is advised as follows:   Please complete x-ray on the first floor. For pain you may use ibuprofen as needed and/or Salon pas patches. Call if symptoms worsen or if symptoms are not improved in 1-2 weeks.   This visit occurred during the SARS-CoV-2 public health emergency.  Safety protocols were in place, including screening questions prior to the visit, additional usage of staff PPE, and extensive cleaning of exam room while observing appropriate contact time as indicated for disinfecting solutions.

## 2020-09-10 NOTE — Patient Instructions (Signed)
Please complete x-ray on the first floor. For pain you may use ibuprofen as needed and/or Salon pas patches. Call if symptoms worsen or if symptoms are not improved in 1-2 weeks.

## 2020-09-16 ENCOUNTER — Ambulatory Visit (INDEPENDENT_AMBULATORY_CARE_PROVIDER_SITE_OTHER): Payer: 59 | Admitting: Family Medicine

## 2020-09-16 ENCOUNTER — Encounter: Payer: Self-pay | Admitting: Family Medicine

## 2020-09-16 ENCOUNTER — Other Ambulatory Visit: Payer: Self-pay

## 2020-09-16 VITALS — BP 106/68 | HR 55 | Temp 97.4°F | Ht 76.0 in | Wt 170.0 lb

## 2020-09-16 DIAGNOSIS — R0781 Pleurodynia: Secondary | ICD-10-CM

## 2020-09-16 MED ORDER — MELOXICAM 15 MG PO TABS: 15.0000 mg | ORAL_TABLET | Freq: Every day | ORAL | 0 refills | Status: DC

## 2020-09-16 MED ORDER — CYCLOBENZAPRINE HCL 10 MG PO TABS: 5.0000 mg | ORAL_TABLET | Freq: Three times a day (TID) | ORAL | 0 refills | Status: DC | PRN

## 2020-09-16 NOTE — Patient Instructions (Addendum)
This is not your spleen. If you had issues with that, you would have figured this out by now and had symptoms such as shaking, fevers, sweats.   Listen to your body. This can last up to 6 weeks.  Ice/cold pack over area for 10-15 min twice daily.  Heat (pad or rice pillow in microwave) over affected area, 10-15 minutes twice daily.   Take Flexeril (cyclobenzaprine) 1-2 hours before planned bedtime. If it makes you drowsy, do not take during the day. You can try half a tab the following night.  OK to take Tylenol 1000 mg (2 extra strength tabs) or 975 mg (3 regular strength tabs) every 6 hours as needed.  Let us know if you need anything.

## 2020-09-16 NOTE — Progress Notes (Signed)
Musculoskeletal Exam  Patient: Stanley Petty DOB: [birth year]  DOS: 09/16/2020  SUBJECTIVE:  Chief Complaint:   Chief Complaint  Patient presents with  . Rib pain   Stanley Petty is a 54 y.o.  male for evaluation and treatment of L side pain.   Onset:  6 days ago. Was leaning over a ditch.  Location: L lower rib cage Character:  aching and stabbing  Progression of issue:  It started getting better and then worsened Associated symptoms: no bruising, swelling, redness, sob, N/V/D, bleeding Treatment: to date has been OTC NSAIDS and topical lidocaine.   Neurovascular symptoms: no Patient had an x-ray of the left rib cage that was unremarkable. He states his main reason for being here is concern for possible splenic or other internal organ derangement.  Past Medical History:  Diagnosis Date  . Adenomatous colon polyp   . Duodenitis   . Gastritis   . GERD (gastroesophageal reflux disease)   . IBS (irritable bowel syndrome)    Dr Earlean Shawl  . Internal hemorrhoids   . Skin cancer 03/2018   L hand, ?[facility]    Objective: VITAL SIGNS: BP 106/68 (BP Location: Left Arm, Patient Position: Sitting, Cuff Size: Normal)   Pulse (!) 55   Temp (!) 97.4 F (36.3 C) (Oral)   Ht 6\' 4"  (1.93 m)   Wt 170 lb (77.1 kg)   SpO2 99%   BMI 20.69 kg/m  Constitutional: Well formed, well developed. No acute distress. Thorax & Lungs: No accessory muscle use Musculoskeletal: Left rib cage.   Tenderness to palpation: Yes, over the left lower rib cage at ribs 9 through 11 over the anterior mid axillary line There is no CVA tenderness Deformity: no Ecchymosis: no There is no crepitus  Neurologic: Normal sensory function.  Psychiatric: Normal mood. Age appropriate judgment and insight. Alert & oriented x 3.    Assessment:  Rib pain on left side - Plan: meloxicam (MOBIC) 15 MG tablet, cyclobenzaprine (FLEXERIL) 10 MG tablet  Plan: Stretches/exercises, heat, ice, Tylenol.  Warnings about Flexeril  verbalized and written down.  Counseled the patient that this is not likely related to any splenic injury or other internal organ issue. F/u as needed. The patient voiced understanding and agreement to the plan.  Greater than 30 minutes were spent face to face with the patient counseling on his dx and how it is extremely unlikely that he has an internal issue, treatment, reviewing his XR with him and reviewing his previous visit.   [place], DO 09/16/20  4:32 PM

## 2020-11-26 ENCOUNTER — Telehealth: Payer: Self-pay | Admitting: Cardiovascular Disease

## 2020-11-26 ENCOUNTER — Telehealth: Payer: Self-pay

## 2020-11-26 NOTE — Telephone Encounter (Signed)
Nurse Assessment Nurse: Rodney Cruise, RN, Sean Date/Time (Eastern Time): 11/26/2020 8:10:00 AM Confirm and document reason for call. If symptomatic, describe symptoms. ---Caller states feels like is going to pass out, when driving at 3pm yesterday felt like was going to pass out, like head was closing down. Went back to office and sat down for an hour and felt better. Is ok now. Does the patient have any new or worsening symptoms? ---Yes Will a triage be completed? ---Yes Related visit to physician within the last 2 weeks? ---No Does the PT have any chronic conditions? (i.e. diabetes, asthma, this includes High risk factors for pregnancy, etc.) ---Yes List chronic conditions. ---Hyperlipidemia Is this a behavioral health or substance abuse call? ---No Guidelines Guideline Title Affirmed Question Affirmed Notes Nurse Date/Time (Eastern Time) Fainting [1] Age > 50 years AND [2] now alert and feels fine Baxter, RN, Hilliard Clark 11/26/2020 8:12:47 AM Disp. Time Eilene Ghazi Time) Disposition Final User 11/26/2020 8:07:01 AM Send to Urgent Ezra Sites 11/26/2020 8:20:13 AM Go to ED Now (or PCP triage) Yes Baxter, RN, Hilliard Clark PLEASE NOTE: All timestamps contained within this report are represented as [place] Standard Time. CONFIDENTIALTY NOTICE: This fax transmission is intended only for the addressee. It contains information that is legally privileged, confidential or otherwise protected from use or disclosure. If you are not the intended recipient, you are strictly prohibited from reviewing, disclosing, copying using or disseminating any of this information or taking any action in reliance on or regarding this information. If you have received this fax in error, please notify us immediately by telephone so that we can arrange for its return to [place]. Phone: [phone], Toll-Free: [phone], Fax: [phone] Page: 2 of 2 Call Id: [id number] [place] Disagree/Comply Disagree Caller Understands  Yes PreDisposition InappropriateToAsk Care Advice Given Per Guideline GO TO ED NOW (OR PCP TRIAGE): CARE ADVICE given per Fainting (Adult) guideline. ANOTHER ADULT SHOULD DRIVE: * It is better and safer if another adult drives instead of you. Comments User: Miles Costain, RN Date/Time Eilene Ghazi Time): 11/26/2020 8:20:01 AM Contacted backline per profile, advised office per triage and client denying ED for symptoms. Office states will call client back. Closing call. Referrals GO TO FACILITY REFUSED GO TO FACILITY OTHER - SPECIFY

## 2020-11-26 NOTE — Telephone Encounter (Signed)
Noted  

## 2020-11-26 NOTE — Telephone Encounter (Signed)
New message:    Patient calling stating that on Friday he was driving he had a head rush as  If he was getting going to pass out and has a headache. It all so happen again yesterday. Please call patient.

## 2020-11-26 NOTE — Telephone Encounter (Signed)
Okay, if he calls, needs office visit.  If symptoms are ongoing: ER

## 2020-11-26 NOTE — Telephone Encounter (Signed)
Triage called our office. Spoke with RN. He states the patient called complaining of an episode where he felt like he was going to pass out while he was driving. He states the feeling went away after about an hour.   Patient was advised to go to ER several times for eval but declined. Patient refuses to go. Please advise on further instruction.

## 2020-11-27 ENCOUNTER — Ambulatory Visit (INDEPENDENT_AMBULATORY_CARE_PROVIDER_SITE_OTHER): Payer: 59 | Admitting: Internal Medicine

## 2020-11-27 ENCOUNTER — Other Ambulatory Visit: Payer: Self-pay

## 2020-11-27 ENCOUNTER — Encounter: Payer: Self-pay | Admitting: Internal Medicine

## 2020-11-27 VITALS — BP 112/76 | HR 58 | Temp 98.1°F | Resp 16 | Wt 170.2 lb

## 2020-11-27 DIAGNOSIS — R55 Syncope and collapse: Secondary | ICD-10-CM | POA: Diagnosis not present

## 2020-11-27 DIAGNOSIS — F419 Anxiety disorder, unspecified: Secondary | ICD-10-CM

## 2020-11-27 NOTE — Telephone Encounter (Signed)
Called patient back about his message. Patient complaining of having two episodes of feeling like he was going to pass out. Patient stated it happened last Friday and yesterday. Patient stated he feels like a flushing feeling from his chest that goes up wards to his head and makes him feel like he is going to pass out. Patient stated it feels like everything is almost stopping or shutting down. Patient stated the first time he was drinking and pulled over until his symptoms resolved. Patient stated the episode yesterday was not as bad. Patient stated he called his PCP and they said for him to go to the ED. Patient stated he does not want to go to the ED, with everything going on right now with COVID. Patient did say his family and coworkers have been sick with COVID, but he has been staying away from everyone. Encouraged patient to get tested for COVID to rule it out. Encouraged patient to go to ED or call 911 if he has another episode. Made patient an appointment with Dr. Johnsie Cancel to be evaluated on Friday since he had a high Ca score and history of low HR. Encouraged patient to also call his PCP back to get in to see them in case his symptoms are not cardiac related. Patient agreed to plan and will come in on Friday to see Dr. Johnsie Cancel.

## 2020-11-27 NOTE — Progress Notes (Unsigned)
Subjective:    Patient ID: Stanley Petty, male    DOB: 1966-06-18, 55 y.o.   MRN: [id number]  DOS:  11/27/2020 Type of visit - description: Acute  On 11/22/2020, he was driving and suddenly felt the following: "Like something was coming over me and close down on me, lightheadedness?  Daydreaming?"  He was driving, he rolled the windows down and after he got some fresh air, 20 seconds later he felt fine.  Similar thing happened 2 days later but symptoms were less intense and lasted also a short time.  After that particular episode he had 2 episodes of diarrhea.  Review of Systems Denies fever chills No recent URI symptoms No chest pain, shortness of breath, edema.  No palpitations. No nausea or vomiting. No seizure activity, bladder or bowel incontinence. No LOC Denies feeling anxious or depressed but admits to  some stress. Occasionally feels fatigued. No headache, dizziness.  No double vision or slurred speech.  No motor deficits  Past Medical History:  Diagnosis Date  . Adenomatous colon polyp   . Duodenitis   . Gastritis   . GERD (gastroesophageal reflux disease)   . IBS (irritable bowel syndrome)    Dr Earlean Shawl  . Internal hemorrhoids   . Skin cancer 03/2018   L hand, ?[place]    Past Surgical History:  Procedure Laterality Date  . CHOLECYSTECTOMY N/A 01/15/2017   Procedure: LAPAROSCOPIC CHOLECYSTECTOMY;  Surgeon: Ralene Ok, MD;  Location: [facility];  Service: General;  Laterality: N/A;  . COLONOSCOPY    . CYSTOSCOPY     Dr Risa Grill  . HEMORRHOID BANDING     [place]   . hemorrhoidal banding    . MULTIPLE TOOTH EXTRACTIONS    . SURGERY ON TESTICLE     at age 63    Allergies as of 11/27/2020      Reactions   Vicodin [hydrocodone-acetaminophen] Nausea And Vomiting   Pt states when takes whole pill has nausea and vomiting but has taken 1/2 tab without difficulty       Medication List       Accurate as of November 27, 2020 11:59 PM. If you have any questions, ask your  nurse or doctor.        STOP taking these medications   cyclobenzaprine 10 MG tablet Commonly known as: FLEXERIL Stopped by: Kathlene November, MD   meloxicam 15 MG tablet Commonly known as: MOBIC Stopped by: Kathlene November, MD     TAKE these medications   aspirin EC 81 MG tablet Take 81 mg by mouth daily.   atorvastatin 20 MG tablet Commonly known as: LIPITOR Take 1 tablet (20 mg total) by mouth daily.          Objective:   Physical Exam BP 112/76   Pulse (!) 58   Temp 98.1 F (36.7 C)   Resp 16   Wt 170 lb 3.2 oz (77.2 kg)   SpO2 98%   BMI 20.72 kg/m  General:   Well developed, NAD, BMI noted.  HEENT:  Normocephalic . Face symmetric, atraumatic. Normal carotid pulses Lungs:  CTA B Normal respiratory effort, no intercostal retractions, no accessory muscle use. Heart: RRR,  no murmur.  Abdomen:  Not distended, soft, non-tender. No rebound or rigidity.   Skin: Not pale. Not jaundice Lower extremities: no pretibial edema bilaterally  Neurologic:  alert & oriented X3.  Speech normal, gait appropriate for age and unassisted. EOMI, pupils equal and reactive Psych--  Cognition and judgment appear  intact.  Cooperative with normal attention span and concentration.  Behavior appropriate. No anxious or depressed appearing.     Assessment     Assessment IBS  Hemorrhoids, h/o banding Skin cancer , [facility]? L hand 03/2018 2012: Abdominal pain, [place] 2 liver lesions; HIDA: decreased EF  +FH CAD -ETT normal 10-2014 -Coronary calcium score 300 (04-2020), high -ETT 04/2020 neg   PLAN Presyncope: 2 episodes of presyncope, lasted 20 seconds, as described above. On clinical grounds it is unlikely that he had a TIA, migraine, seizure, MI, etc. I did notice his heart rate is in the 50s, fast-possible symptomatic bradycardia?  I also wonder about a panic attack. He seems anxious and very worried. Multiple questions about this issue answered to the best of my ability, he has an  appointment to see cardiology in a couple of days, if they do not think he has any heart issue or symptomatic bradycardia then he may need further evaluation.  Neurology?  Repeat labs?  (CBC, CMP etc.). Anxiety: He seems very apprehensive, I wonder if his anxiety is playing a role in his symptoms. He does not think so. Myalgias: See last visit, work-up negative, symptoms essentially resolved. Preventive care: Declined immunizations RTC for a CPX at his convenience   Time spent 35 minutes, extensive listening therapy provided. This visit occurred during the SARS-CoV-2 public health emergency.  Safety protocols were in place, including screening questions prior to the visit, additional usage of staff PPE, and extensive cleaning of exam room while observing appropriate contact time as indicated for disinfecting solutions.

## 2020-11-27 NOTE — Patient Instructions (Addendum)
Schedule a physical at your convenience  

## 2020-11-28 NOTE — Progress Notes (Signed)
CARDIOLOGY CONSULT NOTE       Patient ID: Stanley Petty MRN: [id number] DOB/AGE: 11/15/1965 55 y.o.  Admit date: (Not on file) Referring Physician: Larose Kells Primary Physician: Colon Branch, MD Primary Cardiologist: New Reason for Consultation: Family history of CAD  Active Problems:   * No active hospital problems. *   HPI:  54 y.o. referred by DR Larose Kells for premature family history of CAD. Both father and paternal uncle had MI/CAD in 35's He is otherwise not on statin, no BP meds and non diabetic Quit smoking August 2020 but still sneaks some when he has beer  Long standing history of GERD/Esophagitis and father with history of esophageal cancer as well  LDL 129 01/29/20   Calcium Score 05/21/20 300 39 st percentile for age and sex   Called office 11/27/20 with pre syncope no chest pain or palpitations 2 Episodes one Friday and one Wednesday lasting minutes   Pain seems to start in epigastric area and radiate up esophagus No palpitations chest pain or dyspnea   ROS All other systems reviewed and negative except as noted above  Past Medical History:  Diagnosis Date  . Adenomatous colon polyp   . Duodenitis   . Gastritis   . GERD (gastroesophageal reflux disease)   . IBS (irritable bowel syndrome)    Dr Earlean Shawl  . Internal hemorrhoids   . Skin cancer 03/2018   L hand, ?[facility]    Family History  Problem Relation Age of Onset  . Heart disease Father 65       MI, stent age 73  . Prostate cancer Father        dx ~ 53  . Esophageal cancer Father 72  . Hyperlipidemia Sister   . Coronary artery disease Paternal Uncle   . Colon polyps Mother        also gallstones  . Colon cancer Maternal Grandfather   . Diabetes Neg Hx   . Stomach cancer Neg Hx     Social History   Socioeconomic History  . Marital status: Married    Spouse name: Not on file  . Number of children: 2  . Years of education: Not on file  . Highest education level: Not on file  Occupational History  .  Occupation: plumbing, has his own co  Tobacco Use  . Smoking status: Former Smoker    Packs/day: 1.00    Types: Cigarettes    Quit date: 05/31/2019    Years since quitting: 1.5  . Smokeless tobacco: Never Used  . Tobacco comment: minimal tobacco since ~ 05/2020  Vaping Use  . Vaping Use: Never used  Substance and Sexual Activity  . Alcohol use: Yes    Alcohol/week: 0.0 standard drinks    Comment: socially   . Drug use: No  . Sexual activity: Not on file  Other Topics Concern  . Not on file  Social History Narrative   2 children: 1987, 2008   Social Determinants of Radio broadcast assistant Strain: Not on file  Food Insecurity: Not on file  Transportation Needs: Not on file  Physical Activity: Not on file  Stress: Not on file  Social Connections: Not on file  Intimate Partner Violence: Not on file    Past Surgical History:  Procedure Laterality Date  . CHOLECYSTECTOMY N/A 01/15/2017   Procedure: LAPAROSCOPIC CHOLECYSTECTOMY;  Surgeon: Ralene Ok, MD;  Location: [facility];  Service: General;  Laterality: N/A;  . COLONOSCOPY    . CYSTOSCOPY  Dr Risa Grill  . HEMORRHOID BANDING     [place]   . hemorrhoidal banding    . MULTIPLE TOOTH EXTRACTIONS    . SURGERY ON TESTICLE     at age 64      Current Outpatient Medications:  .  aspirin EC 81 MG tablet, Take 81 mg by mouth daily., Disp: , Rfl:  .  atorvastatin (LIPITOR) 20 MG tablet, Take 1 tablet (20 mg total) by mouth daily., Disp: 90 tablet, Rfl: 3    Physical Exam: Blood pressure (!) 144/76, pulse 64, height 6\' 4"  (1.93 m), weight 76.7 kg, SpO2 98 %.    Affect appropriate Healthy:  appears stated age 55: normal Neck supple with no adenopathy JVP normal no bruits no thyromegaly Lungs clear with no wheezing and good diaphragmatic motion Heart:  S1/S2 no murmur, no rub, gallop or click PMI normal Abdomen: benighn, BS positve, no tenderness, no AAA no bruit.  No HSM or HJR post cholecystectomy  Distal pulses  intact with no bruits No edema Neuro non-focal Skin warm and dry No muscular weakness   Labs:   Lab Results  Component Value Date   WBC 8.5 06/11/2020   HGB 15.5 06/11/2020   HCT 46.5 06/11/2020   MCV 94.5 06/11/2020   PLT 228.0 06/11/2020   No results for input(s): NA, K, CL, CO2, BUN, CREATININE, CALCIUM, PROT, BILITOT, ALKPHOS, ALT, AST, GLUCOSE in the last 168 hours.  Invalid input(s): LABALBU Lab Results  Component Value Date   CKTOTAL 88 06/11/2020   TROPONINI <0.01 06/15/2016    Lab Results  Component Value Date   CHOL 127 08/21/2020   CHOL 194 01/29/2020   CHOL 186 04/22/2018   Lab Results  Component Value Date   HDL 49 08/21/2020   HDL 45.30 01/29/2020   HDL 43.70 04/22/2018   Lab Results  Component Value Date   LDLCALC 66 08/21/2020   LDLCALC 129 (H) 01/29/2020   LDLCALC 130 (H) 04/22/2018   Lab Results  Component Value Date   TRIG 53 08/21/2020   TRIG 95.0 01/29/2020   TRIG 62.0 04/22/2018   Lab Results  Component Value Date   CHOLHDL 2.6 08/21/2020   CHOLHDL 4 01/29/2020   CHOLHDL 4 04/22/2018   No results found for: LDLDIRECT    Radiology: No results found.  EKG: 2018 SB rate 50 hyperacute T waves anteriorly  04/30/20 SR Rate 50 normal    ASSESSMENT AND PLAN:   1. Family History Premature CAD:  Elevated calcium score normal ETT 05/21/20 continue statin and ASA LDL 66   2. GERD:  Low carb diet continue pepcid/prilosec f/u Dr Hilarie Fredrickson see below   3. Pre Syncope:  ECG has been normal pulse better today not slow Sounds like esophageal spasm causing Vagal episodes. Will observe and if recurs can consider 14 day event monitor but would be unlikely that This represents arhythmia   F/U in 6 months   Signed: Jenkins Rouge 11/29/2020, 3:26 PM

## 2020-11-28 NOTE — Assessment & Plan Note (Signed)
Presyncope: 2 episodes of presyncope, lasted 20 seconds, as described above. On clinical grounds it is unlikely that he had a TIA, migraine, seizure, MI, etc. I did notice his heart rate is in the 50s, fast-possible symptomatic bradycardia?  I also wonder about a panic attack. He seems anxious and very worried. Multiple questions about this issue answered to the best of my ability, he has an appointment to see cardiology in a couple of days, if they do not think he has any heart issue or symptomatic bradycardia then he may need further evaluation.  Neurology?  Repeat labs?  (CBC, CMP etc.). Anxiety: He seems very apprehensive, I wonder if his anxiety is playing a role in his symptoms. He does not think so. Myalgias: See last visit, work-up negative, symptoms essentially resolved. Preventive care: Declined immunizations RTC for a CPX at his convenience

## 2020-11-29 ENCOUNTER — Encounter: Payer: Self-pay | Admitting: Cardiovascular Disease

## 2020-11-29 ENCOUNTER — Ambulatory Visit (INDEPENDENT_AMBULATORY_CARE_PROVIDER_SITE_OTHER): Payer: 59 | Admitting: Cardiovascular Disease

## 2020-11-29 ENCOUNTER — Other Ambulatory Visit: Payer: Self-pay

## 2020-11-29 VITALS — BP 144/76 | HR 64 | Ht 76.0 in | Wt 169.0 lb

## 2020-11-29 DIAGNOSIS — R55 Syncope and collapse: Secondary | ICD-10-CM

## 2020-11-29 NOTE — Patient Instructions (Addendum)
Medication Instructions:  *If you need a refill on your cardiac medications before your next appointment, please call your pharmacy*  Lab Work: If you have labs (blood work) drawn today and your tests are completely normal, you will receive your results only by: Marland Kitchen MyChart Message (if you have MyChart) OR . A paper copy in the mail If you have any lab test that is abnormal or we need to change your treatment, we will call you to review the results.  Testing/Procedures: None ordered today.   Follow-Up: At [facility], you and your health needs are our priority.  As part of our continuing mission to provide you with exceptional heart care, we have created designated Provider Care Teams.  These Care Teams include your primary Cardiologist (physician) and Advanced Practice Providers (APPs -  Physician Assistants and Nurse Practitioners) who all work together to provide you with the care you need, when you need it.  We recommend signing up for the patient portal called "MyChart".  Sign up information is provided on this After Visit Summary.  MyChart is used to connect with patients for Virtual Visits (Telemedicine).  Patients are able to view lab/test results, encounter notes, upcoming appointments, etc.  Non-urgent messages can be sent to your provider as well.   To learn more about what you can do with MyChart, go to [email or web address].    Your next appointment:   5 months  The format for your next appointment:   In Person  Provider:   You may see Jenkins Rouge, MD or one of the following Advanced Practice Providers on your designated Care Team:    Kathyrn Drown, NP

## 2020-12-18 ENCOUNTER — Encounter: Payer: Self-pay | Admitting: Nurse Practitioner

## 2020-12-18 ENCOUNTER — Other Ambulatory Visit: Payer: Self-pay

## 2020-12-18 ENCOUNTER — Other Ambulatory Visit (INDEPENDENT_AMBULATORY_CARE_PROVIDER_SITE_OTHER): Payer: 59

## 2020-12-18 ENCOUNTER — Ambulatory Visit (INDEPENDENT_AMBULATORY_CARE_PROVIDER_SITE_OTHER): Payer: 59 | Admitting: Nurse Practitioner

## 2020-12-18 VITALS — BP 110/64 | HR 66 | Ht 76.0 in | Wt 169.0 lb

## 2020-12-18 DIAGNOSIS — R55 Syncope and collapse: Secondary | ICD-10-CM | POA: Insufficient documentation

## 2020-12-18 DIAGNOSIS — R1906 Epigastric swelling, mass or lump: Secondary | ICD-10-CM | POA: Diagnosis not present

## 2020-12-18 LAB — CBC WITH DIFFERENTIAL/PLATELET
Basophils Absolute: 0.1 10*3/uL (ref 0.0–0.1)
Basophils Relative: 0.9 % (ref 0.0–3.0)
Eosinophils Absolute: 0.3 10*3/uL (ref 0.0–0.7)
Eosinophils Relative: 3.8 % (ref 0.0–5.0)
HCT: 47 % (ref 39.0–52.0)
Hemoglobin: 15.9 g/dL (ref 13.0–17.0)
Lymphocytes Relative: 32.9 % (ref 12.0–46.0)
Lymphs Abs: 2.8 10*3/uL (ref 0.7–4.0)
MCHC: 33.8 g/dL (ref 30.0–36.0)
MCV: 93.4 fl (ref 78.0–100.0)
Monocytes Absolute: 0.8 10*3/uL (ref 0.1–1.0)
Monocytes Relative: 9.4 % (ref 3.0–12.0)
Neutro Abs: 4.6 10*3/uL (ref 1.4–7.7)
Neutrophils Relative %: 53 % (ref 43.0–77.0)
Platelets: 229 10*3/uL (ref 150.0–400.0)
RBC: 5.03 Mil/uL (ref 4.22–5.81)
RDW: 13.2 % (ref 11.5–15.5)
WBC: 8.6 10*3/uL (ref 4.0–10.5)

## 2020-12-18 LAB — COMPREHENSIVE METABOLIC PANEL
ALT: 21 U/L (ref 0–53)
AST: 20 U/L (ref 0–37)
Albumin: 4.2 g/dL (ref 3.5–5.2)
Alkaline Phosphatase: 79 U/L (ref 39–117)
BUN: 16 mg/dL (ref 6–23)
CO2: 28 mEq/L (ref 19–32)
Calcium: 9.3 mg/dL (ref 8.4–10.5)
Chloride: 103 mEq/L (ref 96–112)
Creatinine, Ser: 1.05 mg/dL (ref 0.40–1.50)
GFR: 79.03 mL/min (ref >60.00)
Glucose, Bld: 72 mg/dL (ref 70–99)
Potassium: 4.2 mEq/L (ref 3.5–5.1)
Sodium: 137 mEq/L (ref 135–145)
Total Bilirubin: 0.7 mg/dL (ref 0.2–1.2)
Total Protein: 7 g/dL (ref 6.0–8.3)

## 2020-12-18 MED ORDER — FAMOTIDINE 20 MG PO TABS: 20.0000 mg | ORAL_TABLET | Freq: Two times a day (BID) | ORAL | 2 refills | Status: DC

## 2020-12-18 NOTE — Progress Notes (Signed)
12/18/2020 JI FELDNER [id number] [birth year]   Chief Complaint: Possible indigestion, near fainting episodes   History of Present Illness: Stanley Petty is a 55 year old male with a past medical history of GERD, IBS and colon polyps. Past cholecystectomy. He is followed by Dr. Hilarie Fredrickson. He presents to our office today for further evaluation regarding atypical near syncope episodes x 3 which occurred while driving. 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 No headaches, dizziness, confusion or visual changes.  No new medications. He felt fine for the next 2 days. Monday 1/31 he ate a sausage and egg sandwich for breakfast and shortly after while driving his work Printmaker he felt a similar wave of feeling unwell, near faint feeling so he pulled off to the side of the road. No dizziness, chest pain or palpitations. He felt the urge to have diarrhea and he went in the back of his plummer Lucianne Lei and passed a nonbloody brown diarrhea bowel movement x 2. He felt fatigued and went home. The next day he felt fine. Two days later, he had a similar episode while driving but stated this was a lighter episode also associated with diarrhea x 1 episode. No further episodes feeling a wave of near syncope or diarrhea. He complains of having some epigastric discomfort and fullness, feels like gas build up which has occurred 3 to 4 times over the past week after supper. He had heartburn one night last week without recurrence. No dysphagia. No lower abdominal pain. He started taking Pepcid 20mg  po daily 5 or 7 days ago but he did not take it yesterday or today. He was evaluated by his cardiologist Dr. Johnsie Cancel 11/29/2020 who thought he was possibly having esophageal spasms causing vagal episodes. A Holter monitor was considered if his symptoms recurred. He underwent an EGD 03/13/2020 which showed mild  reflux and gastritis treated with Famotidine 20mg  po bid which he took for several months and his reflux symptoms resolved. He is intolerant to PPI's, reported having SOB with past Pantoprazole use. Father with history of esophageal cancer. He typically passes a normal solid stool once daily. Diarrhea episodes as noted above. He infrequently sees a small amount of bright red blood on the toilet tissue. He underwent a colonoscopy on 03/13/2020 and 3 tubular adenomatous colon polyps were removed. He was advised to repeat a colonoscopy in 3 years. Maternal grandfather with history of colon cancer. He denies having anxiety but admits stress level is elevated. No weight loss. No fever, sweats or chills.   EGD 03/13/2020: - Mild reflux esophagitis with no bleeding. - Gastritis. Biopsied. - Duodenitis. - Normal second portion of the duodenum.  Colonoscopy 03/13/2020: - Two 3 to 5 mm polyps in the ascending colon, removed with a cold snare. Resected and retrieved. - One 6 mm polyp in the transverse colon, removed with a cold snare. Resected and retrieved. - Internal hemorrhoids. - Recall colonoscopy 3 years  Biopsy Report: 1. Surgical [P], gastric bx - GASTRIC ANTRAL AND OXYNTIC MUCOSA WITH PATCHY NONSPECIFIC REACTIVE GASTROPATHY - WARTHIN STARRY STAIN IS NEGATIVE FOR HELICOBACTER PYLORI 2. Surgical [P], colon, ascending, polyp (2) - TUBULAR ADENOMA(S) - NEGATIVE FOR HIGH-GRADE DYSPLASIA OR MALIGNANCY 3. Surgical [P], colon, transverse, polyp - TUBULAR ADENOMA(S) - NEGATIVE FOR HIGH-GRADE DYSPLASIA OR MALIGNANCY  Current Outpatient Medications on File Prior to Visit  Medication Sig  Dispense Refill   aspirin EC 81 MG tablet Take 81 mg by mouth daily.     atorvastatin (LIPITOR) 20 MG tablet Take 1 tablet (20 mg total) by mouth daily. 90 tablet 3   No current facility-administered medications on file prior to visit.   Allergies  Allergen Reactions   Vicodin [Hydrocodone-Acetaminophen] Nausea  And Vomiting    Pt states when takes whole pill has nausea and vomiting but has taken 1/2 tab without difficulty    Current Medications, Allergies, Past Medical History, Past Surgical History, Family History and Social History were reviewed in [employer] record.  Review of Systems:   Constitutional: Negative for fever, sweats, chills or weight loss.  Respiratory: Negative for shortness of breath.   Cardiovascular: Negative for chest pain, palpitations and leg swelling.  Gastrointestinal: See HPI.  Musculoskeletal: Negative for back pain or muscle aches.  Neurological: Negative for dizziness, headaches or paresthesias.   Physical Exam: BP 110/64    Pulse 66    Ht 6\' 4"  (1.93 m)    Wt 169 lb (76.7 kg)    BMI 20.57 kg/m    General: Thin 55 year old male in no acute distress. Head: Normocephalic and atraumatic. Eyes: No scleral icterus. Conjunctiva pink . Ears: Normal auditory acuity. Mouth: Dentition intact. No ulcers or lesions.  Lungs: Clear throughout to auscultation. Heart: Regular rate and rhythm, no murmur. Abdomen: Soft, nontender and nondistended. No masses or hepatomegaly. Normal bowel sounds x 4 quadrants.  Rectal: Deferred.  Musculoskeletal: Symmetrical with no gross deformities. Extremities: No edema. Neurological: Alert oriented x 4. No focal deficits.  Psychological: Alert and cooperative. Normal mood and affect  Assessment and Recommendations:  42. 55 year old male with atypical near syncope episodes x 3 which have occurred while driving with associated diarrhea with 2 of these episodes. Etiology unclear: GI vs cardiac vs neuro -Recommend Holter monitor with cardiology -Recommend neuro evaluation, EEG and head CT -CBC, CMP -Patient to call office if the above symptoms recur. Advised no driving if symptoms recur.   2. History of GERD. Epigastric fullness x 1 week, gas with infrequent heartburn. No N/V or dysphagia. EGD 02/2020 showed mild  reflux and gastritis. Father with history of esophageal cancer.  - UGI series to evaluate the esophagus and stomach -Consider repeat EGD if symptoms worsen -Famotidine 20mg  po bid  3. History of tubular adenomatous polyps per colonoscopy 02/2020 -Next colonoscopy due 02/2023

## 2020-12-18 NOTE — Patient Instructions (Addendum)
If you are age 57 or younger, your body mass index should be between 19-25. Your Body mass index is 20.57 kg/m. If this is out of the aformentioned range listed, please consider follow up with your Primary Care Provider.   LABS:  Lab work has been ordered for you today. Our lab is located in the basement. Press "B" on the elevator. The lab is located at the first door on the left as you exit the elevator.  HEALTHCARE LAWS AND MY CHART RESULTS: Due to recent changes in healthcare laws, you may see the results of your imaging and laboratory studies on MyChart before your provider has had a chance to review them.   We understand that in some cases there may be results that are confusing or concerning to you. Not all laboratory results come back in the same time frame and the provider may be waiting for multiple results in order to interpret others.  Please give [place] 48 hours in order for your provider to thoroughly review all the results before contacting the office for clarification of your results.   You will be contacted to scheduled the Upper GI Series and Small Bowel Follow If you do not receive a call by next week, please contact radiology at [phone]. --------------------------------------------------------------------------------------------------------------- An upper GI series uses x rays to help diagnose problems of the upper GI tract, which includes the esophagus, stomach, and duodenum. The duodenum is the first part of the small intestine. An upper GI series is conducted by a radiology technologist or a radiologist--a doctor who specializes in x-ray imaging--at a hospital or outpatient center. While sitting or standing in front of an x-ray machine, the patient drinks barium liquid, which is often white and has a chalky consistency and taste. The barium liquid coats the lining of the upper GI tract and makes signs of disease show up more clearly on x rays. X-ray video, called fluoroscopy, is  used to view the barium liquid moving through the esophagus, stomach, and duodenum. Additional x rays and fluoroscopy are performed while the patient lies on an x-ray table. To fully coat the upper GI tract with barium liquid, the technologist or radiologist may press on the abdomen or ask the patient to change position. Patients hold still in various positions, allowing the technologist or radiologist to take x rays of the upper GI tract at different angles. If a technologist conducts the upper GI series, a radiologist will later examine the images to look for problems.  This test typically takes about 1 hour to complete --------------------------------------------------------------------------------------------------------------------------------------------- The Small Bowel Follow Thru examination is used to visualize the entire small bowel (intestines); specifically the connection between the small and large intestine. You will be positioned on a flat x-ray table and an image of your abdomen taken. Then the technologist will show the x-ray to the radiologist. The radiologist will instruct your technologist how much (1-2 cups) barium sulfate you will drink and when to begin taking the timed x-rays, usually 15-30 minutes after you begin drinking. Barium is a harmless substance that will highlight your small intestine by absorbing x-ray. The taste is chalky and it feels very heavy both in the cup and in your stomach.  After the first x-ray is taken and shown to the radiologist, he/she will determine when the next image is to be taken. This is repeated until the barium has reached the end of the small intestine and enters the beginning of the colon (cecum). At such time when the barium  spills into the colon, you will be positioned on the x-ray table once again. The radiologist will use a fluoroscopic camera to take some detailed pictures of the connection between your small intestine and colon. The fluoroscope  is an x-ray unit that works with a television/computer screen. The radiologist will apply pressure to your abdomen with his/her hand and a lead glove, a plastic paddle, or a paddle with an inflated rubber balloon on the end. This is to spread apart your loops of intestine so he/she can see all areas.   This test typically takes around 1 hour to complete.   Drink plenty of water (8-10 cups/day) for a few days following the procedure to avoid constipation and blockage. The barium will make your stools white for a few days. --------------------------------------------------------------------------------------------------------------------------------------------   MEDICATION We have sent the following medication to your pharmacy for you to pick up at your convenience: Famotidine 20 MG twice a day.  Recommend a Neurologist consultation. Please call our office if your symptoms worsen. It was great seeing you today! Thank you for entrusting me with your care and choosing [facility].  Noralyn Pick, CRNP

## 2020-12-20 NOTE — Progress Notes (Signed)
Addendum: Reviewed and agree with assessment and management plan. Pyrtle, Jay M, MD  

## 2021-02-04 ENCOUNTER — Encounter: Payer: Self-pay | Admitting: Internal Medicine

## 2021-02-04 ENCOUNTER — Ambulatory Visit (INDEPENDENT_AMBULATORY_CARE_PROVIDER_SITE_OTHER): Payer: 59 | Admitting: Internal Medicine

## 2021-02-04 ENCOUNTER — Other Ambulatory Visit: Payer: Self-pay

## 2021-02-04 VITALS — BP 114/72 | HR 54 | Temp 97.7°F | Resp 16 | Ht 76.0 in | Wt 170.4 lb

## 2021-02-04 DIAGNOSIS — Z0000 Encounter for general adult medical examination without abnormal findings: Secondary | ICD-10-CM

## 2021-02-04 DIAGNOSIS — R55 Syncope and collapse: Secondary | ICD-10-CM | POA: Diagnosis not present

## 2021-02-04 DIAGNOSIS — Z0001 Encounter for general adult medical examination with abnormal findings: Secondary | ICD-10-CM

## 2021-02-04 DIAGNOSIS — R399 Unspecified symptoms and signs involving the genitourinary system: Secondary | ICD-10-CM

## 2021-02-04 LAB — LIPID PANEL
Cholesterol: 117 mg/dL (ref 0–200)
HDL: 42.1 mg/dL (ref >39.00)
LDL Cholesterol: 63 mg/dL (ref 0–99)
NonHDL: 75.08
Total CHOL/HDL Ratio: 3
Triglycerides: 62 mg/dL (ref 0.0–149.0)
VLDL: 12.4 mg/dL (ref 0.0–40.0)

## 2021-02-04 LAB — URINALYSIS
Bilirubin Urine: NEGATIVE
Hgb urine dipstick: NEGATIVE
Ketones, ur: NEGATIVE
Leukocytes,Ua: NEGATIVE
Nitrite: NEGATIVE
Specific Gravity, Urine: 1.02 (ref 1.000–1.030)
Total Protein, Urine: NEGATIVE
Urine Glucose: NEGATIVE
Urobilinogen, UA: 0.2 (ref 0.0–1.0)
pH: 6 (ref 5.0–8.0)

## 2021-02-04 LAB — HEPATIC FUNCTION PANEL
ALT: 21 U/L (ref 0–53)
AST: 18 U/L (ref 0–37)
Albumin: 4 g/dL (ref 3.5–5.2)
Alkaline Phosphatase: 87 U/L (ref 39–117)
Bilirubin, Direct: 0.2 mg/dL (ref 0.0–0.3)
Total Bilirubin: 0.7 mg/dL (ref 0.2–1.2)
Total Protein: 6.5 g/dL (ref 6.0–8.3)

## 2021-02-04 MED ORDER — FAMOTIDINE 20 MG PO TABS: 20.0000 mg | ORAL_TABLET | Freq: Two times a day (BID) | ORAL | 1 refills | Status: DC

## 2021-02-04 NOTE — Patient Instructions (Addendum)
Please call your gastroenterologist and pursue the x-ray of your esophagus     GO TO THE LAB : Get the blood work     Petty, Stanley back for a checkup in 4 to 6 months   "Living will", "[place] of attorney": Advanced care planning  (If you already have a living will or healthcare power of attorney, please bring the copy to be scanned in your chart.)  Advance care planning is a process that supports adults in  understanding and sharing their preferences regarding future medical care.   The patient's preferences are recorded in documents called Advance Directives.    Advanced directives are completed (and can be modified at any time) while the patient is in full mental capacity.   The documentation should be available at all times to the patient, the family and the healthcare providers.  Bring in a copy to be scanned in your chart is an excellent idea and is recommended   This legal documents direct treatment decision making and/or appoint a surrogate to make the decision if the patient is not capable to do so.    Advance directives can be documented in many types of formats,  documents have names such as:  Lliving will  Durable power of attorney for healthcare (healthcare proxy or healthcare power of attorney)  Combined directives  Physician orders for life-sustaining treatment    More information at:  [email or web address]

## 2021-02-04 NOTE — Progress Notes (Signed)
Subjective:    Patient ID: Stanley Petty, male    DOB: [birth year], 55 y.o.   MRN: [id number]  DOS:  02/04/2021 Type of visit - description:  CPX  Presyncope: Chart reviewed, saw cardiology and GI.  He has been taking famotidine as recommended. Also, 2 weeks ago he stopped taking aspirin and since then presyncopal feeling is essentially gone.  Wonders if there is a relationship. LUTS: On and off symptoms, urinary urgency, nocturia sometimes.  Request a urology referral.   Review of Systems Specifically denies chest pain or difficulty breathing.  No blood in the urine or in the stools.  No fever chills.  Other than above, a 14 point review of systems is negative      Past Medical History:  Diagnosis Date  . Adenomatous colon polyp   . Duodenitis   . Gastritis   . GERD (gastroesophageal reflux disease)   . IBS (irritable bowel syndrome)    Dr Earlean Shawl  . Internal hemorrhoids   . Skin cancer 03/2018   L hand, ?[place]    Past Surgical History:  Procedure Laterality Date  . CHOLECYSTECTOMY N/A 01/15/2017   Procedure: LAPAROSCOPIC CHOLECYSTECTOMY;  Surgeon: Ralene Ok, MD;  Location: [facility];  Service: General;  Laterality: N/A;  . COLONOSCOPY    . CYSTOSCOPY     Dr Risa Grill  . HEMORRHOID BANDING     [facility]   . MULTIPLE TOOTH EXTRACTIONS    . SURGERY ON TESTICLE     at age 32    Allergies as of 02/04/2021      Reactions   Vicodin [hydrocodone-acetaminophen] Nausea And Vomiting   Pt states when takes whole pill has nausea and vomiting but has taken 1/2 tab without difficulty       Medication List       Accurate as of February 04, 2021 11:59 PM. If you have any questions, ask your nurse or doctor.        aspirin EC 81 MG tablet Take 81 mg by mouth daily.   atorvastatin 20 MG tablet Commonly known as: LIPITOR Take 1 tablet (20 mg total) by mouth daily.   famotidine 20 MG tablet Commonly known as: PEPCID Take 1 tablet (20 mg total) by mouth 2 (two) times daily.           Objective:   Physical Exam BP 114/72 (BP Location: Left Arm, Patient Position: Sitting, Cuff Size: Small)   Pulse (!) 54   Temp 97.7 F (36.5 C) (Oral)   Resp 16   Ht 6\' 4"  (1.93 m)   Wt 170 lb 6 oz (77.3 kg)   SpO2 95%   BMI 20.74 kg/m       General: Well developed, NAD, BMI noted Neck: No  thyromegaly  HEENT:  Normocephalic . Face symmetric, atraumatic Lungs:  CTA B Normal respiratory effort, no intercostal retractions, no accessory muscle use. Heart: RRR,  no murmur.  Abdomen:  Not distended, soft, non-tender. No rebound or rigidity.   Lower extremities: no pretibial edema bilaterally  Skin: Exposed areas without rash. Not pale. Not jaundice Neurologic:  alert & oriented X3.  Speech normal, gait appropriate for age and unassisted Strength symmetric and appropriate for age.  Psych: Cognition and judgment appear intact.  Cooperative with normal attention span and concentration.  Behavior appropriate. Apprehensive but not depressed appearing.  Assessment     Assessment IBS  Hemorrhoids, h/o banding Skin cancer , BCC? L hand 03/2018 2012: Abdominal pain, [place] 2 liver  lesions; HIDA: decreased EF  +FH CAD -ETT normal 10-2014 -Coronary calcium score 300 (04-2020), high -ETT 04/2020 neg   PLAN Here for CPX, multiple other issues discussed. + FH CAD: Plan is to control CV RF. Presyncope:  Since the last visit saw cardiology.  Esophageal spasm causing vaso-vagal  episodes?  They were considering a 14-day event monitor.   Subsequently saw GI, was Rx UGI series (not done) and consider EGD.   Rx H2 blockers. Self d/c ASA 2 weeks ago presyncope has essentially resolved, I am not sure if there is a relationship.  We agreed on the following: -Go back on aspirin with enteric-coating -Continue famotidine, RF sent.  Perhaps he is feeling better because the H2 blockers. -Call GI, pursue upper GI series -Call if symptoms resurface, or increase. RTC 4 to 6 months  In  addition to CPX, I addressed extensively his presyncope symptoms and reviewed the chart.  This visit occurred during the SARS-CoV-2 public health emergency.  Safety protocols were in place, including screening questions prior to the visit, additional usage of staff PPE, and extensive cleaning of exam room while observing appropriate contact time as indicated for disinfecting solutions.

## 2021-02-05 ENCOUNTER — Encounter: Payer: Self-pay | Admitting: Internal Medicine

## 2021-02-05 NOTE — Assessment & Plan Note (Signed)
-   Td ~ 2012 at ortho per pt(no documentation) -Shingrix d/w pt  - Covid vaccine: attempted to counsel "It won't change my mind" --Prostate cancer screening: Used to see Dr Risa Grill; DRE- PSA 2021 in this office wnl.  Has some LUTS, symptoms do not seem to be severe but he strongly requests to check a urine test and refer back to urology.  Will do - CCS: Cscope1-2013, 1 polyp,Cscope 2017, Cscope 02/2020 , next per GI -(+) FH CAD: See comments above -Tobacco: Minimal smoking   -Labs: Reviewed, will get LFTs, FLP, UA. -Advance directive discussed

## 2021-02-05 NOTE — Assessment & Plan Note (Signed)
Here for CPX, multiple other issues discussed. + FH CAD: Plan is to control CV RF. Presyncope:  Since the last visit saw cardiology.  Esophageal spasm causing vaso-vagal  episodes?  They were considering a 14-day event monitor.   Subsequently saw GI, was Rx UGI series (not done) and consider EGD.   Rx H2 blockers. Self d/c ASA 2 weeks ago presyncope has essentially resolved, I am not sure if there is a relationship.  We agreed on the following: -Go back on aspirin with enteric-coating -Continue famotidine, RF sent.  Perhaps he is feeling better because the H2 blockers. -Call GI, pursue upper GI series -Call if symptoms resurface, or increase. RTC 4 to 6 months

## 2021-02-19 ENCOUNTER — Other Ambulatory Visit: Payer: 59

## 2021-07-01 IMAGING — CT CT CARDIAC CORONARY ARTERY CALCIUM SCORE
3 series · 14 of 20 positions shown, 15 images · non-contrast
Comparison: 09/22/2017 chest radiograph.
COMPARISON: 09/22/2017 chest radiograph.

Addendum:
EXAM:
OVER-READ INTERPRETATION  CT CHEST

The following report is an over-read performed by radiologist Dr.
Ryon Priya [REDACTED] on 05/21/2020. This over-read
does not include interpretation of cardiac or coronary anatomy or
pathology. The calcium score interpretation by the cardiologist is
attached.
CLINICAL DATA: Risk stratification
Coronary Calcium Score
TECHNIQUE: The patient was scanned on a Siemens Somatom 64 slice scanner. Axial
non-contrast 3 mm slices were carried out through the heart. The
data set was analyzed on a dedicated work station and scored using
the Agatson method.

[Series 2: casc 3.0 bv41 2 bestdiast 72 % · axial · 0.31mm/px · z∈[-263,-167]mm · 4 of 53 slices shown, 5 images]
[im 11/53  vessel]
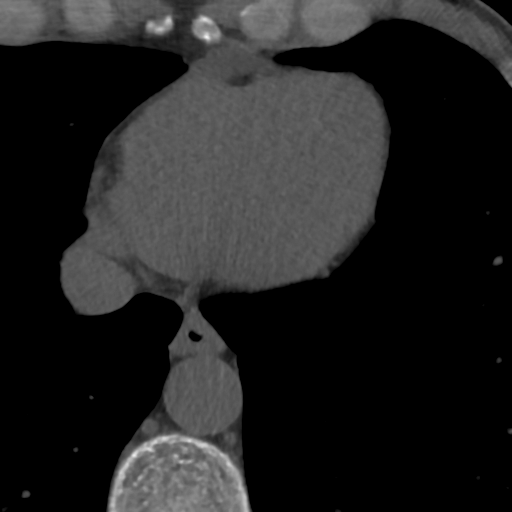
[im 11/53  lung]
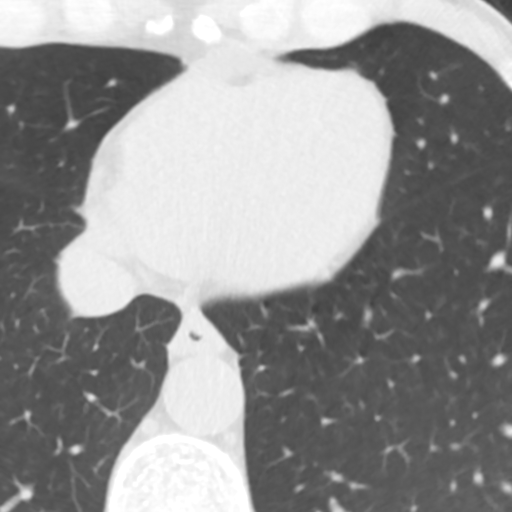
[im 21/53  vessel]
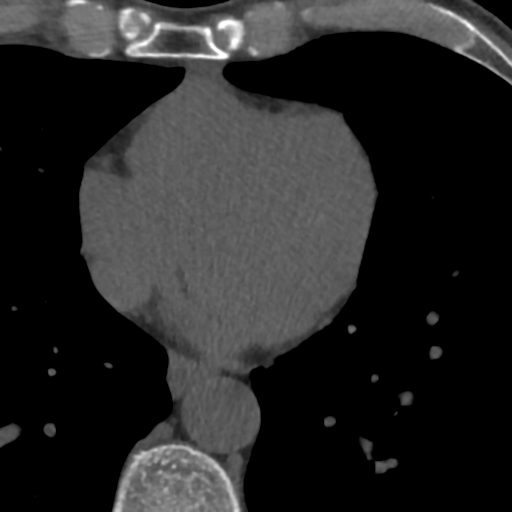
[im 32/53  vessel]
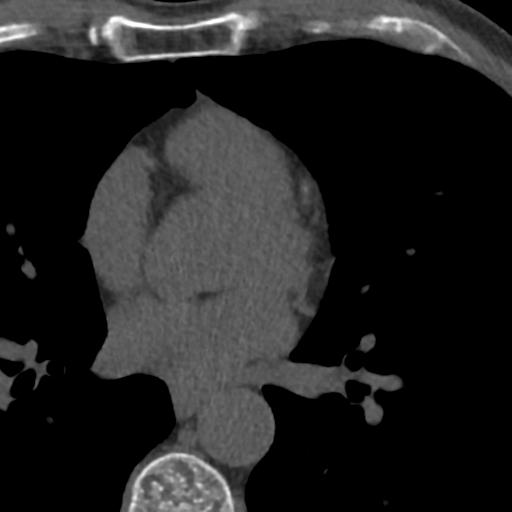
[im 42/53  vessel]
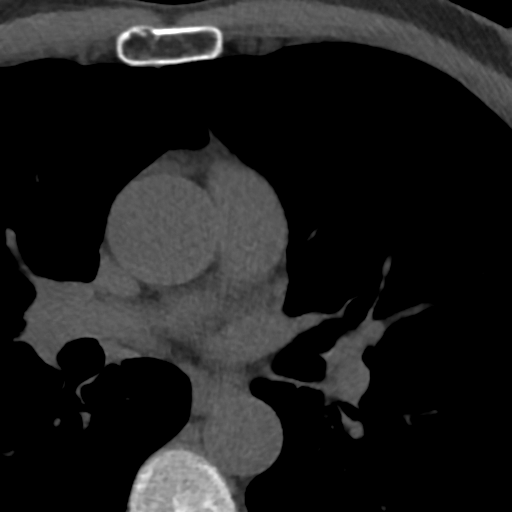

[Series 3: lung 71 % · axial · 0.69mm/px · z∈[-268,-160]mm · 5 of 54 slices shown]
[im 9/54  lung]
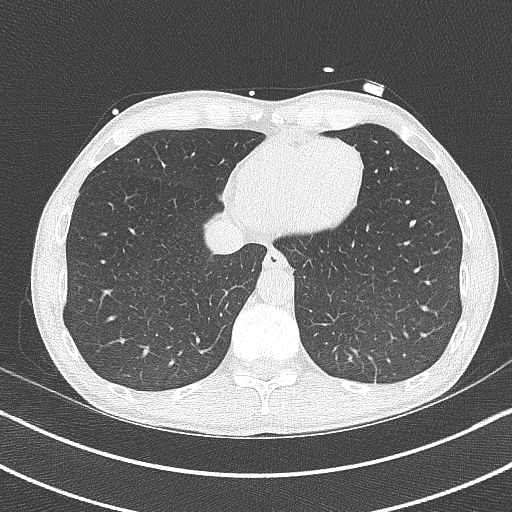
[im 18/54  lung]
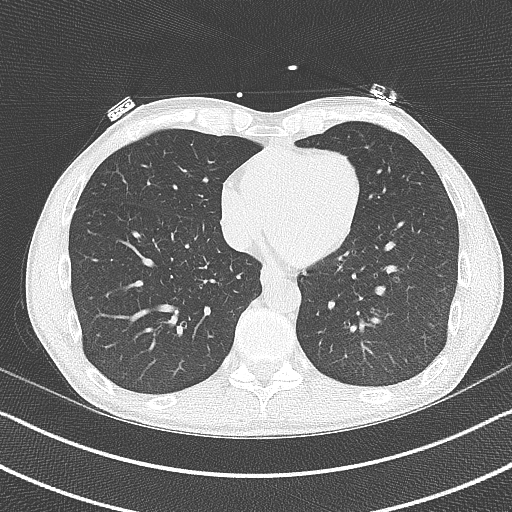
[im 27/54  lung]
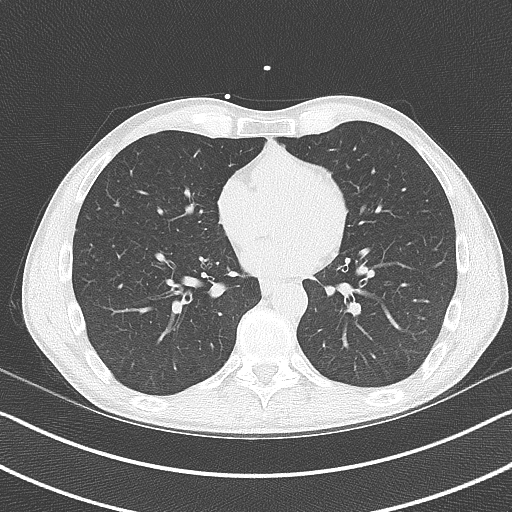
[im 36/54  lung]
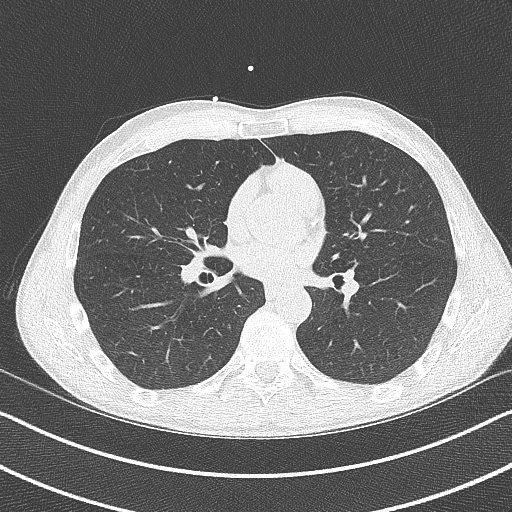
[im 45/54  lung]
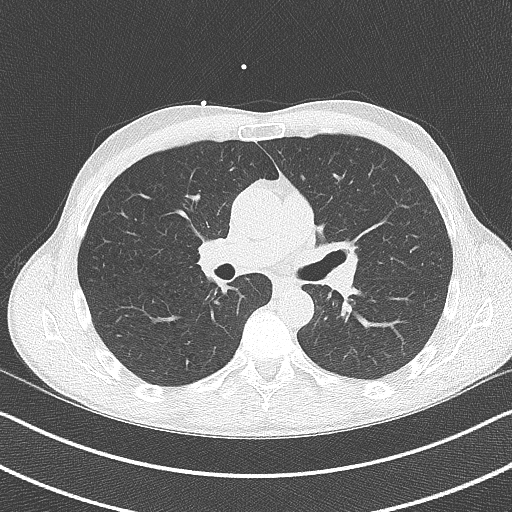

[Series 4: lung st 71 % · axial · 0.69mm/px · z∈[-268,-160]mm · 5 of 54 slices shown]
[im 9/54  lung]
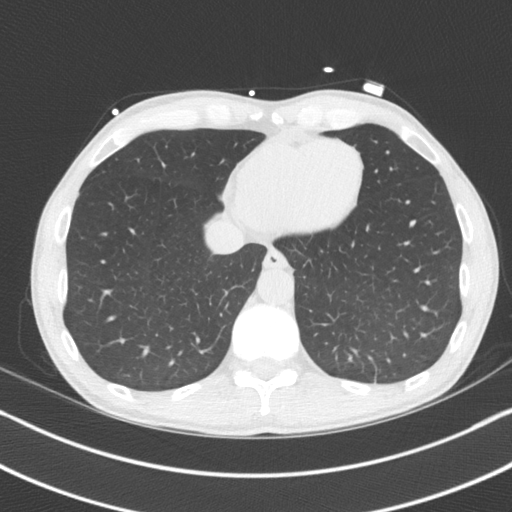
[im 18/54  lung]
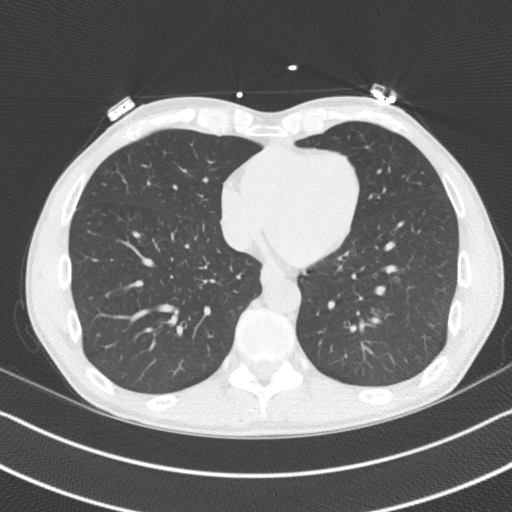
[im 27/54  lung]
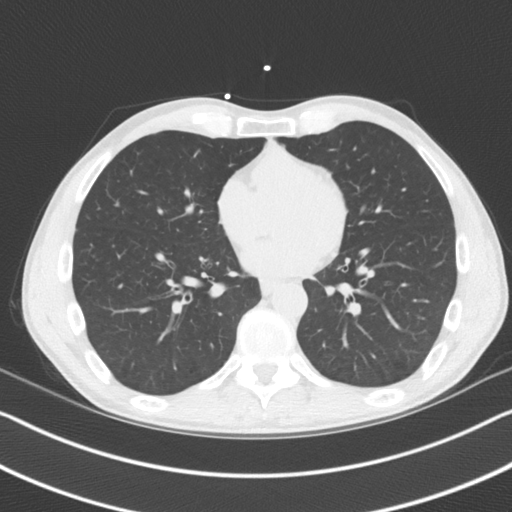
[im 36/54  lung]
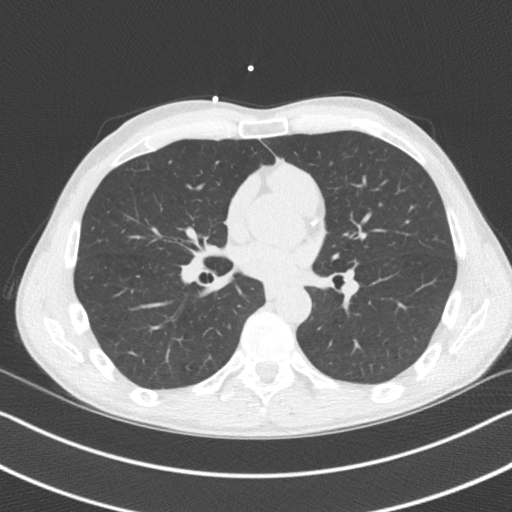
[im 45/54  lung]
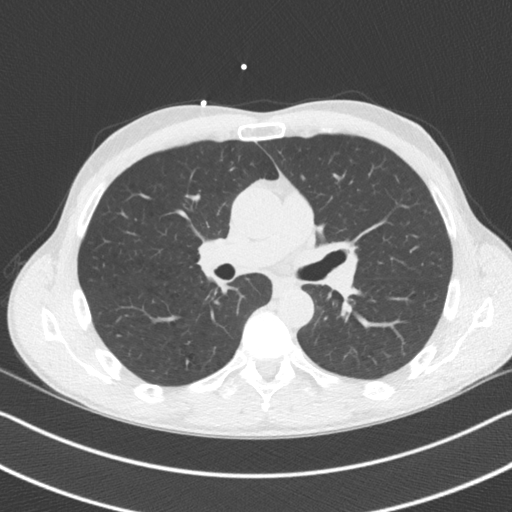

[14 of 20 positions shown; findings below may reference images not displayed]

FINDINGS: Vascular: Normal aortic caliber.

Mediastinum/Nodes: No imaged thoracic adenopathy.

Lungs/Pleura: No pleural fluid. Mild centrilobular emphysema. Left
base scarring.

Upper Abdomen: Normal imaged portions of the liver, spleen, stomach.

Musculoskeletal: No acute osseous abnormality.
IMPRESSION: 1.  No acute findings in the imaged extracardiac chest.
2.  Emphysema (FZLFG-XWZ.7).
FINDINGS: Non-cardiac: See separate report from [REDACTED].

Ascending aorta: Upper limits normal diameter 3.7 cm

Pericardium: Normal

Coronary arteries: Calcium noted through out the LAD and proximal
and mid circumflex
IMPRESSION: Coronary calcium score of 300. This was 91 st percentile for age and
sex matched control.

Patient should be on statin and 81 mg REIMER

Oyinyechi Alechenu

*** End of Addendum ***
EXAM:
OVER-READ INTERPRETATION  CT CHEST

The following report is an over-read performed by radiologist Dr.
Ryon Priya [REDACTED] on 05/21/2020. This over-read
does not include interpretation of cardiac or coronary anatomy or
pathology. The calcium score interpretation by the cardiologist is
attached.
FINDINGS: Vascular: Normal aortic caliber.

Mediastinum/Nodes: No imaged thoracic adenopathy.

Lungs/Pleura: No pleural fluid. Mild centrilobular emphysema. Left
base scarring.

Upper Abdomen: Normal imaged portions of the liver, spleen, stomach.

Musculoskeletal: No acute osseous abnormality.
IMPRESSION: 1.  No acute findings in the imaged extracardiac chest.
2.  Emphysema (FZLFG-XWZ.7).

## 2021-07-17 ENCOUNTER — Ambulatory Visit (INDEPENDENT_AMBULATORY_CARE_PROVIDER_SITE_OTHER): Payer: 59 | Admitting: Otolaryngology

## 2021-10-03 ENCOUNTER — Other Ambulatory Visit: Payer: Self-pay | Admitting: Cardiovascular Disease

## 2021-11-19 NOTE — Progress Notes (Signed)
CARDIOLOGY CONSULT NOTE       Patient ID: NEKODA CHOCK MRN: [id number] DOB/AGE: 1966-07-24 56 y.o.  Admit date: (Not on file) Referring Physician: Larose Kells Primary Physician: Colon Branch, MD Primary Cardiologist: New Reason for Consultation: Family history of CAD  Active Problems:   * No active hospital problems. *   HPI:  56 y.o. referred by DR Larose Kells 04/30/20 for premature family history of CAD. Both father and paternal uncle had MI/CAD in 67's He is otherwise not on statin, no BP meds and non diabetic Quit smoking August 2020 but still sneaks some when he has beer  Long standing history of GERD/Esophagitis and father with history of esophageal cancer as well  LDL 129 01/29/20   Calcium Score 05/21/20 300 81 st percentile for age and sex   Called office 11/27/20 with pre syncope no chest pain or palpitations 2 Episodes one Friday and one Wednesday lasting minutes Seemed more GI / esophageal in nature with vagal component   Sees Pyrtle EGD 03/13/20 showed esophagitis/gastritis and family history of esophageal  Cancer Taking famotidine and stopped ASA and pre syncopal feeling gone Primary wanted him on enteric coated asa and to f/u for EGD and or upper GI series   Myalgias started with lipitor even though he's been on it for a year Discussed changing to crestor and if this fails PSK 9  I take care of his dad Ozzie who owns [employer] and [place] works for him    ROS All other systems reviewed and negative except as noted above  Past Medical History:  Diagnosis Date   Adenomatous colon polyp    Duodenitis    Gastritis    GERD (gastroesophageal reflux disease)    IBS (irritable bowel syndrome)    Dr Earlean Shawl   Internal hemorrhoids    Skin cancer 03/2018   L hand, ?[place]    Family History  Problem Relation Age of Onset   Heart disease Father 25       MI, stent age 25   Prostate cancer Father        dx ~ 78   Esophageal cancer Father 41   Hyperlipidemia Sister    Coronary artery  disease Paternal Uncle    Colon polyps Mother        also gallstones   Colon cancer Maternal Grandfather    Diabetes Neg Hx    Stomach cancer Neg Hx     Social History   Socioeconomic History   Marital status: Married    Spouse name: Not on file   Number of children: 2   Years of education: Not on file   Highest education level: Not on file  Occupational History   Occupation: plumbing, has his own co    Comment: [employer]  Tobacco Use   Smoking status: Former    Packs/day: 1.00    Types: Cigarettes    Quit date: 05/31/2019    Years since quitting: 2.4   Smokeless tobacco: Never   Tobacco comments:    minimal tobacco since ~ 05/2020  Vaping Use   Vaping Use: Never used  Substance and Sexual Activity   Alcohol use: Yes    Alcohol/week: 0.0 standard drinks    Comment: socially    Drug use: No   Sexual activity: Not on file  Other Topics Concern   Not on file  Social History Narrative   2 children: 1987, 2008   Social Determinants of Radio broadcast assistant  Strain: Not on file  Food Insecurity: Not on file  Transportation Needs: Not on file  Physical Activity: Not on file  Stress: Not on file  Social Connections: Not on file  Intimate Partner Violence: Not on file    Past Surgical History:  Procedure Laterality Date   CHOLECYSTECTOMY N/A 01/15/2017   Procedure: LAPAROSCOPIC CHOLECYSTECTOMY;  Surgeon: Ralene Ok, MD;  Location: [facility];  Service: General;  Laterality: N/A;   COLONOSCOPY     CYSTOSCOPY     Dr Risa Grill   HEMORRHOID BANDING     [facility]    MULTIPLE TOOTH EXTRACTIONS     SURGERY ON TESTICLE     at age 63      Current Outpatient Medications:    aspirin EC 81 MG tablet, Take 81 mg by mouth daily., Disp: , Rfl:    atorvastatin (LIPITOR) 20 MG tablet, Take 1 tablet (20 mg total) by mouth daily. Please make yearly appt with Dr. Johnsie Cancel for February 2023 for future refills. Thank you 1st attempt, Disp: 90 tablet, Rfl: 0   famotidine (PEPCID) 20  MG tablet, Take 1 tablet (20 mg total) by mouth 2 (two) times daily., Disp: 180 tablet, Rfl: 1    Physical Exam: There were no vitals taken for this visit.    Affect appropriate Healthy:  appears stated age 39: normal Neck supple with no adenopathy JVP normal no bruits no thyromegaly Lungs clear with no wheezing and good diaphragmatic motion Heart:  S1/S2 no murmur, no rub, gallop or click PMI normal Abdomen: benighn, BS positve, no tenderness, no AAA no bruit.  No HSM or HJR post cholecystectomy  Distal pulses intact with no bruits No edema Neuro non-focal Skin warm and dry No muscular weakness   Labs:   Lab Results  Component Value Date   WBC 8.6 12/18/2020   HGB 15.9 12/18/2020   HCT 47.0 12/18/2020   MCV 93.4 12/18/2020   PLT 229.0 12/18/2020   No results for input(s): NA, K, CL, CO2, BUN, CREATININE, CALCIUM, PROT, BILITOT, ALKPHOS, ALT, AST, GLUCOSE in the last 168 hours.  Invalid input(s): LABALBU Lab Results  Component Value Date   CKTOTAL 88 06/11/2020   TROPONINI <0.01 06/15/2016    Lab Results  Component Value Date   CHOL 117 02/04/2021   CHOL 127 08/21/2020   CHOL 194 01/29/2020   Lab Results  Component Value Date   HDL 42.10 02/04/2021   HDL 49 08/21/2020   HDL 45.30 01/29/2020   Lab Results  Component Value Date   LDLCALC 63 02/04/2021   LDLCALC 66 08/21/2020   LDLCALC 129 (H) 01/29/2020   Lab Results  Component Value Date   TRIG 62.0 02/04/2021   TRIG 53 08/21/2020   TRIG 95.0 01/29/2020   Lab Results  Component Value Date   CHOLHDL 3 02/04/2021   CHOLHDL 2.6 08/21/2020   CHOLHDL 4 01/29/2020   No results found for: LDLDIRECT    Radiology: No results found.  EKG: 2018 SB rate 50 hyperacute T waves anteriorly  04/30/20 SR Rate 50 normal  11/21/2021 NSR rate 58 normal    ASSESSMENT AND PLAN:   1. Family History Premature CAD:  Elevated calcium score normal ETT 05/21/20 continue statin and ASA LDL 66 Change lipitor to  crestor due to myalgias Labs in 3 months   2. GERD:  Low carb diet continue pepcid/prilosec f/u Dr Hilarie Fredrickson see below   3. Pre Syncope:  ECG has been normal pulse better today not slow  Sounds like esophageal spasm causing Vagal episodes.     Crestor 5 mg  Lipid / Liver 3 months D/c lipitor  F/U ina year    Signed: Jenkins Rouge 11/19/2021, 10:01 AM

## 2021-11-21 ENCOUNTER — Other Ambulatory Visit: Payer: Self-pay

## 2021-11-21 ENCOUNTER — Ambulatory Visit (INDEPENDENT_AMBULATORY_CARE_PROVIDER_SITE_OTHER): Payer: Managed Care, Other (non HMO) | Admitting: Cardiovascular Disease

## 2021-11-21 VITALS — BP 110/80 | HR 57 | Ht 76.0 in | Wt 169.0 lb

## 2021-11-21 DIAGNOSIS — R55 Syncope and collapse: Secondary | ICD-10-CM | POA: Diagnosis not present

## 2021-11-21 DIAGNOSIS — Z8249 Family history of ischemic heart disease and other diseases of the circulatory system: Secondary | ICD-10-CM

## 2021-11-21 DIAGNOSIS — E785 Hyperlipidemia, unspecified: Secondary | ICD-10-CM | POA: Diagnosis not present

## 2021-11-21 MED ORDER — ROSUVASTATIN CALCIUM 5 MG PO TABS: 5.0000 mg | ORAL_TABLET | Freq: Every day | ORAL | 3 refills | Status: DC

## 2021-11-21 NOTE — Patient Instructions (Addendum)
Medication Instructions:  Your physician has recommended you make the following change in your medication:  1-STOP Lipitor 2-START Crestor 5 mg by mouth daily.  *If you need a refill on your cardiac medications before your next appointment, please call your pharmacy*  Lab Work: Your physician recommends that you return for lab work in: 3 months for fasting lipid and liver panel.  If you have labs (blood work) drawn today and your tests are completely normal, you will receive your results only by: [place] (if you have MyChart) OR A paper copy in the mail If you have any lab test that is abnormal or we need to change your treatment, we will call you to review the results.  Testing/Procedures: None ordered today.  Follow-Up: At [facility], you and your health needs are our priority.  As part of our continuing mission to provide you with exceptional heart care, we have created designated Provider Care Teams.  These Care Teams include your primary Cardiologist (physician) and Advanced Practice Providers (APPs -  Physician Assistants and Nurse Practitioners) who all work together to provide you with the care you need, when you need it.  We recommend signing up for the patient portal called "MyChart".  Sign up information is provided on this After Visit Summary.  MyChart is used to connect with patients for Virtual Visits (Telemedicine).  Patients are able to view lab/test results, encounter notes, upcoming appointments, etc.  Non-urgent messages can be sent to your provider as well.   To learn more about what you can do with MyChart, go to [email or web address].    Your next appointment:   6 month(s)  The format for your next appointment:   In Person  Provider:   Jenkins Rouge, MD {

## 2021-12-26 ENCOUNTER — Encounter: Payer: Self-pay | Admitting: Internal Medicine

## 2022-02-18 ENCOUNTER — Other Ambulatory Visit: Payer: Managed Care, Other (non HMO) | Admitting: *Deleted

## 2022-02-18 DIAGNOSIS — R55 Syncope and collapse: Secondary | ICD-10-CM

## 2022-02-18 DIAGNOSIS — E785 Hyperlipidemia, unspecified: Secondary | ICD-10-CM

## 2022-02-18 DIAGNOSIS — Z8249 Family history of ischemic heart disease and other diseases of the circulatory system: Secondary | ICD-10-CM

## 2022-02-19 LAB — LIPID PANEL
Chol/HDL Ratio: 3.1 ratio (ref 0.0–5.0)
Cholesterol, Total: 141 mg/dL (ref 100–199)
HDL: 45 mg/dL (ref >39)
LDL Chol Calc (NIH): 80 mg/dL (ref 0–99)
Triglycerides: 82 mg/dL (ref 0–149)
VLDL Cholesterol Cal: 16 mg/dL (ref 5–40)

## 2022-02-19 LAB — HEPATIC FUNCTION PANEL
ALT: 20 IU/L (ref 0–44)
AST: 18 IU/L (ref 0–40)
Albumin: 4.3 g/dL (ref 3.8–4.9)
Alkaline Phosphatase: 89 IU/L (ref 44–121)
Bilirubin Total: 0.4 mg/dL (ref 0.0–1.2)
Bilirubin, Direct: 0.13 mg/dL (ref 0.00–0.40)
Total Protein: 6.4 g/dL (ref 6.0–8.5)

## 2022-02-20 ENCOUNTER — Telehealth: Payer: Self-pay

## 2022-02-20 DIAGNOSIS — E785 Hyperlipidemia, unspecified: Secondary | ICD-10-CM

## 2022-02-20 MED ORDER — ROSUVASTATIN CALCIUM 10 MG PO TABS: 10.0000 mg | ORAL_TABLET | Freq: Every day | ORAL | 3 refills | Status: DC

## 2022-02-20 NOTE — Telephone Encounter (Signed)
-----   Message from Josue Hector, MD sent at 02/19/2022  9:08 AM EDT ----- ?LDL > 70 see if he can tolerate 10 mg of crestor and repeat labs in 3 months  ?

## 2022-02-20 NOTE — Telephone Encounter (Signed)
The patient has been notified of the result and verbalized understanding.  All questions (if any) were answered. ?Michaelyn Barter, RN 02/20/2022 3:12 PM  ? ?Will place order for Crestor 10 mg and lab work. Patient will come in on 05/27/22 for lab work. ?

## 2022-03-03 ENCOUNTER — Ambulatory Visit (INDEPENDENT_AMBULATORY_CARE_PROVIDER_SITE_OTHER): Payer: Commercial Managed Care - HMO | Admitting: Internal Medicine

## 2022-03-03 ENCOUNTER — Encounter: Payer: Self-pay | Admitting: Internal Medicine

## 2022-03-03 VITALS — BP 116/74 | HR 55 | Temp 97.9°F | Resp 16 | Ht 76.0 in | Wt 165.4 lb

## 2022-03-03 DIAGNOSIS — Z0000 Encounter for general adult medical examination without abnormal findings: Secondary | ICD-10-CM | POA: Diagnosis not present

## 2022-03-03 DIAGNOSIS — E7800 Pure hypercholesterolemia, unspecified: Secondary | ICD-10-CM

## 2022-03-03 LAB — CBC WITH DIFFERENTIAL/PLATELET
Basophils Absolute: 0.1 10*3/uL (ref 0.0–0.1)
Basophils Relative: 0.8 % (ref 0.0–3.0)
Eosinophils Absolute: 0.5 10*3/uL (ref 0.0–0.7)
Eosinophils Relative: 6.3 % — ABNORMAL HIGH (ref 0.0–5.0)
HCT: 47.2 % (ref 39.0–52.0)
Hemoglobin: 15.7 g/dL (ref 13.0–17.0)
Lymphocytes Relative: 32.3 % (ref 12.0–46.0)
Lymphs Abs: 2.5 10*3/uL (ref 0.7–4.0)
MCHC: 33.2 g/dL (ref 30.0–36.0)
MCV: 95.1 fl (ref 78.0–100.0)
Monocytes Absolute: 0.7 10*3/uL (ref 0.1–1.0)
Monocytes Relative: 9.2 % (ref 3.0–12.0)
Neutro Abs: 3.9 10*3/uL (ref 1.4–7.7)
Neutrophils Relative %: 51.4 % (ref 43.0–77.0)
Platelets: 220 10*3/uL (ref 150.0–400.0)
RBC: 4.96 Mil/uL (ref 4.22–5.81)
RDW: 13.8 % (ref 11.5–15.5)
WBC: 7.7 10*3/uL (ref 4.0–10.5)

## 2022-03-03 LAB — BASIC METABOLIC PANEL
BUN: 13 mg/dL (ref 6–23)
CO2: 27 mEq/L (ref 19–32)
Calcium: 8.8 mg/dL (ref 8.4–10.5)
Chloride: 105 mEq/L (ref 96–112)
Creatinine, Ser: 1.04 mg/dL (ref 0.40–1.50)
GFR: 79.27 mL/min (ref >60.00)
Glucose, Bld: 92 mg/dL (ref 70–99)
Potassium: 4.4 mEq/L (ref 3.5–5.1)
Sodium: 139 mEq/L (ref 135–145)

## 2022-03-03 LAB — PSA: PSA: 0.48 ng/mL (ref 0.10–4.00)

## 2022-03-03 MED ORDER — SILDENAFIL CITRATE 20 MG PO TABS: 60.0000 mg | ORAL_TABLET | Freq: Every evening | ORAL | 3 refills | Status: DC | PRN

## 2022-03-03 NOTE — Assessment & Plan Note (Signed)
Here for CPX. ?Presyncope: No further events. Cardiology felt pre syncope was d/t esophageal spasms causing vasovagal events. ?FH CAD, dyslipidemia: ?Last visit with cardiology 11/21/2021, they change Lipitor to Crestor due to myalgias.  To have future FLP by cardiology soon. ?ED: Requests Viagra, I see no contraindications, how to use it and what to expect discussed. ?RTC 1 year ?

## 2022-03-03 NOTE — Assessment & Plan Note (Signed)
?-   Td ~ 2012 : declined booster again today ?-Shingrix: Recommended ?- Covid vaccine: None, see previous entries ?--Prostate cancer screening:  Failed urology referral last year.  C/o nocturia, he is already working on  see urology thus does not need a referral again; , requests PSA: will do.  Deferred DRE. ?- CCS: Cscope 10-2011, 1 polyp, Cscope 2017, Cscope 02/2020 , next per GI ?- Tobacco:  smokes very rarely ?-Labs reviewed, check BMP, CBC, PSA.  FLP per cards  ?-Advance directive discussed ? ?

## 2022-03-03 NOTE — Patient Instructions (Addendum)
GO TO THE LAB : Get the blood work   ? ? ?[place], [place] ?Come back for a physical in 1 year ? ? ? ? ? ?"Living will", "Health Care Power of attorney": Advanced care planning ? ?(If you already have a living will or healthcare power of attorney, please bring the copy to be scanned in your chart.) ? ?Advance care planning is a process that supports adults in  understanding and sharing their preferences regarding future medical care.  ? ?The patient's preferences are recorded in documents called Advance Directives.    ?Advanced directives are completed (and can be modified at any time) while the patient is in full mental capacity.  ? ?The documentation should be available at all times to the patient, the family and the healthcare providers.  ?Bring in a copy to be scanned in your chart is an excellent idea and is recommended  ? ?This legal documents direct treatment decision making and/or appoint a surrogate to make the decision if the patient is not capable to do so.  ? ? ?Advance directives can be documented in many types of formats,  documents have names such as:  ?Lliving will  ?Durable power of attorney for healthcare (healthcare proxy or healthcare power of attorney)  ?Combined directives  ?Physician orders for life-sustaining treatment  ?  ?More information at: ? ?[email or web address]  ?

## 2022-03-03 NOTE — Progress Notes (Signed)
? ?Subjective:  ? ? Patient ID: Stanley Petty, male    DOB: 1966-09-02, 56 y.o.   MRN: [id number] ? ?DOS:  03/03/2022 ?Type of visit - description: CPX ? ?Here for CPX. ?In general feeling well. ?Having erectile dysfunction issues: Decreased quality of erections, libido normal.  Viagra?Marland Kitchen    ?History of presyncope: Resolved ? ?Review of Systems ?Occasional nocturia, denies dysuria, gross hematuria. ? ?Other than above, a 14 point review of systems is negative  ? ? ?Past Medical History:  ?Diagnosis Date  ? Adenomatous colon polyp   ? Duodenitis   ? Gastritis   ? GERD (gastroesophageal reflux disease)   ? IBS (irritable bowel syndrome)   ? Dr Earlean Shawl  ? Internal hemorrhoids   ? Skin cancer 03/2018  ? L hand, ?[facility]  ? ? ?Past Surgical History:  ?Procedure Laterality Date  ? CHOLECYSTECTOMY N/A 01/15/2017  ? Procedure: LAPAROSCOPIC CHOLECYSTECTOMY;  Surgeon: Ralene Ok, MD;  Location: [facility];  Service: General;  Laterality: N/A;  ? COLONOSCOPY    ? CYSTOSCOPY    ? Dr Risa Grill  ? HEMORRHOID BANDING    ? [facility]   ? MULTIPLE TOOTH EXTRACTIONS    ? SURGERY ON TESTICLE    ? at age 45  ? ? ?Social History  ? ?Socioeconomic History  ? Marital status: Married  ?  Spouse name: Not on file  ? Number of children: 2  ? Years of education: Not on file  ? Highest education level: Not on file  ?Occupational History  ? Occupation: plumbing, has his own co  ?  Comment: [employer]  ?Tobacco Use  ? Smoking status: Former  ?  Packs/day: 1.00  ?  Types: Cigarettes  ?  Quit date: 05/31/2019  ?  Years since quitting: 2.7  ? Smokeless tobacco: Never  ? Tobacco comments:  ?  Hardly ever uses tobacco  ?Vaping Use  ? Vaping Use: Never used  ?Substance and Sexual Activity  ? Alcohol use: Yes  ?  Alcohol/week: 0.0 standard drinks  ?  Comment: socially   ? Drug use: No  ? Sexual activity: Not on file  ?Other Topics Concern  ? Not on file  ?Social History Narrative  ? 2 children: 1987, 2008  ? Household: pt, wife, son  ? ?Social Determinants of Health   ? ?Financial Resource Strain: Not on file  ?Food Insecurity: Not on file  ?Transportation Needs: Not on file  ?Physical Activity: Not on file  ?Stress: Not on file  ?Social Connections: Not on file  ?Intimate Partner Violence: Not on file  ? ? ?Current Outpatient Medications  ?Medication Instructions  ? aspirin EC 81 mg, Oral, Daily  ? rosuvastatin (CRESTOR) 10 mg, Oral, Daily  ? sildenafil (REVATIO) 60-80 mg, Oral, At bedtime PRN  ? ? ?   ?Objective:  ? Physical Exam ?BP 116/74 (BP Location: Left Arm, Patient Position: Sitting, Cuff Size: Small)   Pulse (!) 55   Temp 97.9 ?F (36.6 ?C) (Oral)   Resp 16   Ht '6\' 4"'$  (1.93 m)   Wt 165 lb 6 oz (75 kg)   SpO2 97%   BMI 20.13 kg/m?  ?General: ?Well developed, NAD, BMI noted ?Neck: No  thyromegaly  ?HEENT:  ?Normocephalic . Face symmetric, atraumatic ?Lungs:  ?CTA B ?Normal respiratory effort, no intercostal retractions, no accessory muscle use. ?Heart: RRR,  no murmur.  ?Abdomen:  ?Not distended, soft, non-tender. No rebound or rigidity.   ?Lower extremities: no pretibial edema bilaterally  ?  Skin: Exposed areas without rash. Not pale. Not jaundice ?Neurologic:  ?alert & oriented X3.  ?Speech normal, gait appropriate for age and unassisted ?Strength symmetric and appropriate for age.  ?Psych: ?Cognition and judgment appear intact.  ?Cooperative with normal attention span and concentration.  ?Behavior appropriate. ?No anxious or depressed appearing. ? ?   ?Assessment   ? ?Assessment ?IBS  ?Hemorrhoids, h/o banding ?Skin cancer , BCC? L hand 2019 ?2012: Abdominal pain, [place] 2 liver lesions; HIDA: decreased EF  ?+FH CAD ?-ETT normal 10-2014 ?-Coronary calcium score 300 (04-2020), high ?-ETT 04/2020 neg ? ? ?PLAN ?Here for CPX. ?Presyncope: No further events. Cardiology felt pre syncope was d/t esophageal spasms causing vasovagal events. ?FH CAD, dyslipidemia: ?Last visit with cardiology 11/21/2021, they change Lipitor to Crestor due to myalgias.  To have future FLP by  cardiology soon. ?ED: Requests Viagra, I see no contraindications, how to use it and what to expect discussed. ?RTC 1 year ? ? ?

## 2022-03-04 ENCOUNTER — Telehealth: Payer: Self-pay | Admitting: Internal Medicine

## 2022-03-04 NOTE — Telephone Encounter (Signed)
No cause for concern, eosinophils increase during allergy season. ?

## 2022-03-04 NOTE — Telephone Encounter (Signed)
LMOM informing of PCP comments. Instructed to call if questions/concerns. ?

## 2022-03-04 NOTE — Telephone Encounter (Signed)
Pt would like to know what Eosinophils Relative is and if it is cause for concern as it is elevated. Please advise.         ?

## 2022-05-19 ENCOUNTER — Encounter: Payer: Self-pay | Admitting: Medical

## 2022-05-19 ENCOUNTER — Ambulatory Visit (HOSPITAL_BASED_OUTPATIENT_CLINIC_OR_DEPARTMENT_OTHER)
Admission: RE | Admit: 2022-05-19 | Discharge: 2022-05-19 | Disposition: A | Discharge status: Home / Self Care | Payer: Commercial Managed Care - HMO | Source: Ambulatory Visit | Attending: Medical | Admitting: Medical

## 2022-05-19 ENCOUNTER — Ambulatory Visit (INDEPENDENT_AMBULATORY_CARE_PROVIDER_SITE_OTHER): Payer: Commercial Managed Care - HMO | Admitting: Medical

## 2022-05-19 VITALS — BP 120/70 | HR 63 | Resp 18 | Ht 76.0 in | Wt 162.0 lb

## 2022-05-19 DIAGNOSIS — R0781 Pleurodynia: Secondary | ICD-10-CM | POA: Diagnosis present

## 2022-05-19 DIAGNOSIS — M546 Pain in thoracic spine: Secondary | ICD-10-CM

## 2022-05-19 DIAGNOSIS — R0789 Other chest pain: Secondary | ICD-10-CM

## 2022-05-19 DIAGNOSIS — Z23 Encounter for immunization: Secondary | ICD-10-CM | POA: Diagnosis not present

## 2022-05-19 DIAGNOSIS — M5440 Lumbago with sciatica, unspecified side: Secondary | ICD-10-CM

## 2022-05-19 MED ORDER — MUPIROCIN 2 % EX OINT: 1.0000 | TOPICAL_OINTMENT | Freq: Two times a day (BID) | CUTANEOUS | 0 refills | Status: DC
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
## 2022-05-19 NOTE — Addendum Note (Signed)
Addended by: Jeronimo Greaves on: 05/19/2022 09:47 AM   Modules accepted: Orders

## 2022-05-27 ENCOUNTER — Other Ambulatory Visit: Payer: Commercial Managed Care - HMO

## 2022-05-28 ENCOUNTER — Other Ambulatory Visit: Payer: Commercial Managed Care - HMO

## 2022-05-28 DIAGNOSIS — E785 Hyperlipidemia, unspecified: Secondary | ICD-10-CM

## 2022-05-28 LAB — LIPID PANEL
Chol/HDL Ratio: 2.8 ratio (ref 0.0–5.0)
Cholesterol, Total: 124 mg/dL (ref 100–199)
HDL: 45 mg/dL (ref >39)
LDL Chol Calc (NIH): 67 mg/dL (ref 0–99)
Triglycerides: 55 mg/dL (ref 0–149)
VLDL Cholesterol Cal: 12 mg/dL (ref 5–40)

## 2022-05-28 LAB — HEPATIC FUNCTION PANEL
ALT: 24 IU/L (ref 0–44)
AST: 21 IU/L (ref 0–40)
Albumin: 4.2 g/dL (ref 3.8–4.9)
Alkaline Phosphatase: 86 IU/L (ref 44–121)
Bilirubin Total: 0.5 mg/dL (ref 0.0–1.2)
Bilirubin, Direct: 0.18 mg/dL (ref 0.00–0.40)
Total Protein: 6.4 g/dL (ref 6.0–8.5)

## 2022-06-25 DIAGNOSIS — L309 Dermatitis, unspecified: Secondary | ICD-10-CM | POA: Insufficient documentation

## 2022-06-25 DIAGNOSIS — L719 Rosacea, unspecified: Secondary | ICD-10-CM | POA: Insufficient documentation

## 2022-09-01 ENCOUNTER — Ambulatory Visit (INDEPENDENT_AMBULATORY_CARE_PROVIDER_SITE_OTHER): Payer: Commercial Managed Care - HMO | Admitting: Internal Medicine

## 2022-09-01 ENCOUNTER — Ambulatory Visit (HOSPITAL_BASED_OUTPATIENT_CLINIC_OR_DEPARTMENT_OTHER)
Admission: RE | Admit: 2022-09-01 | Discharge: 2022-09-01 | Disposition: A | Discharge status: Home / Self Care | Payer: Commercial Managed Care - HMO | Source: Ambulatory Visit | Attending: Internal Medicine | Admitting: Internal Medicine

## 2022-09-01 ENCOUNTER — Encounter: Payer: Self-pay | Admitting: Internal Medicine

## 2022-09-01 VITALS — BP 124/66 | HR 60 | Temp 97.9°F | Resp 16 | Ht 76.0 in | Wt 165.2 lb

## 2022-09-01 DIAGNOSIS — S20212A Contusion of left front wall of thorax, initial encounter: Secondary | ICD-10-CM | POA: Diagnosis present

## 2022-09-01 MED ORDER — PANTOPRAZOLE SODIUM 40 MG PO TBEC: 40.0000 mg | DELAYED_RELEASE_TABLET | Freq: Every day | ORAL | 1 refills | Status: DC

## 2022-09-01 NOTE — Patient Instructions (Addendum)
Get a left-sided chest x-ray downstairs  For pain: IBUPROFEN (Advil or Motrin) 200 mg 2 tablets every 12 hours as needed for pain.  Always take it with food because may cause gastritis and ulcers.  If you notice nausea, stomach pain, change in the color of stools --->  Stop the medicine and let us know  Also you can alternate it with Tylenol  500 mg OTC 2 tabs a day every 8 hours  Call if not gradually better in the next couple weeks.

## 2022-09-01 NOTE — Progress Notes (Signed)
   Subjective:    Patient ID: Stanley Petty, male    DOB: 1966-01-21, 56 y.o.   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 In the next 2 to 3 days the pain was moderate to severe, he still has pain now, area is moderately TTP. Pain increases with certain movements. He denies any cough, rash.  No fever or chills.  No abdominal pain per se   Review of Systems See above   Past Medical History:  Diagnosis Date   Adenomatous colon polyp    Duodenitis    Gastritis    GERD (gastroesophageal reflux disease)    IBS (irritable bowel syndrome)    Dr Earlean Shawl   Internal hemorrhoids    Skin cancer 03/2018   L hand, ?[place]    Past Surgical History:  Procedure Laterality Date   CHOLECYSTECTOMY N/A 01/15/2017   Procedure: LAPAROSCOPIC CHOLECYSTECTOMY;  Surgeon: Ralene Ok, MD;  Location: [facility];  Service: General;  Laterality: N/A;   COLONOSCOPY     CYSTOSCOPY     Dr Risa Grill   HEMORRHOID BANDING     [facility]    MULTIPLE TOOTH EXTRACTIONS     SURGERY ON TESTICLE     at age 31    Current Outpatient Medications  Medication Instructions   aspirin EC 81 mg, Oral, Daily   pantoprazole (PROTONIX) 40 mg, Oral, Daily before breakfast   rosuvastatin (CRESTOR) 10 mg, Oral, Daily   sildenafil (REVATIO) 60-80 mg, Oral, At bedtime PRN       Objective:    BP 124/66   Pulse 60   Temp 97.9 F (36.6 C) (Oral)   Resp 16   Ht '6\' 4"'$  (1.93 m)   Wt 165 lb 4 oz (75 kg)   SpO2 96%   BMI 20.11 kg/m  General:   Well developed, NAD, BMI noted. HEENT:  Normocephalic . Face symmetric, atraumatic Lungs:  CTA B Normal respiratory effort, no intercostal retractions, no accessory muscle use. Heart: RRR,  no murmur. Chest wall: Normal to inspection, tender to palpation at the distal anterior left chest  wall.  No crepitus.  No deformities. Abdomen: Soft, nontender Lower extremities: no pretibial edema bilaterally  Skin: No rash at the abdomen, chest or torso Neurologic:  alert & oriented X3.  Speech normal, gait appropriate for age and unassisted Psych--  Cognition and judgment appear intact.  Cooperative with normal attention span and concentration.  Behavior appropriate. No anxious or depressed appearing.      Assessment     Assessment IBS  Hemorrhoids, h/o banding Skin cancer , BCC? L hand 2019 2012: Abdominal pain, [place] 2 liver lesions; HIDA: decreased EF  +FH CAD -ETT normal 10-2014 -Coronary calcium score 300 (04-2020), high -ETT 04/2020 neg   PLAN Chest wall contusion: Symptoms as described above, suspect chest wall contusion, rule out a fracture.  Check x-ray. Patient is taking ibuprofen, recommend GI precautions and do not take it more than twice daily, okay to combine with Tylenol. Call if not gradually better. See AVS

## 2022-09-01 NOTE — Assessment & Plan Note (Signed)
Chest wall contusion: Symptoms as described above, suspect chest wall contusion, rule out a fracture.  Check x-ray. Patient is taking ibuprofen, recommend GI precautions and do not take it more than twice daily, okay to combine with Tylenol. Call if not gradually better. See AVS

## 2022-09-03 ENCOUNTER — Telehealth: Payer: Self-pay | Admitting: Internal Medicine

## 2022-09-03 NOTE — Telephone Encounter (Signed)
Patient calling to follow up on xray that he got done on 09/01/2022. Advised it does not look like results have been sent to provider. Please call patient to advise

## 2022-09-03 NOTE — Telephone Encounter (Signed)
Awaiting radiology read back.

## 2022-09-10 ENCOUNTER — Telehealth: Payer: Self-pay | Admitting: Internal Medicine

## 2022-09-10 NOTE — Telephone Encounter (Signed)
At time of OV on 11/7- Pt could not tell me if he needed pantoprazole or famotidine. We refilled pantoprazole because it was the one that was refilled most recently. He is now stating he needed famotidine instead. Please advise?

## 2022-09-10 NOTE — Telephone Encounter (Signed)
Patient said he received pantoprazole (PROTONIX) 40 MG tablet  but he wanted a refill on the famotidine (PEPCID) 20 MG tablet [[id number]].  Please call to advise

## 2022-09-11 MED ORDER — FAMOTIDINE 20 MG PO TABS: 20.0000 mg | ORAL_TABLET | Freq: Two times a day (BID) | ORAL | 3 refills | Status: DC

## 2022-09-11 NOTE — Telephone Encounter (Signed)
Okay to refill famotidine 20 mg 1 p.o. twice daily, 1 year supply

## 2022-09-11 NOTE — Telephone Encounter (Signed)
Rx sent 

## 2022-11-02 NOTE — Progress Notes (Signed)
CARDIOLOGY CONSULT NOTE       Patient ID: SUTTON HIRSCH MRN: [id number] DOB/AGE: Aug 28, 1966 57 y.o.  Admit date: (Not on file) Referring Physician: Larose Kells Primary Physician: Colon Branch, MD Primary Cardiologist: Johnsie Cancel Reason for Consultation: Family history of CAD    HPI:  57 y.o. referred by DR Larose Kells 04/30/20 for premature family history of CAD. Both father and paternal uncle had MI/CAD in 21's Quit smoking August 2020 but still sneaks some when he has beer  Long standing history of GERD/Esophagitis and father with history of esophageal cancer as well  Calcium Score 05/21/20 300 68 st percentile for age and sex   Called office 11/27/20 with pre syncope no chest pain or palpitations 2 Episodes one Friday and one Wednesday lasting minutes Seemed more GI / esophageal in nature with vagal component   Sees Pyrtle EGD 03/13/20 showed esophagitis/gastritis and family history of esophageal  Cancer Taking famotidine and stopped ASA and pre syncopal feeling gone Primary wanted him on enteric coated asa and to f/u for EGD and or upper GI series   Myalgias started with lipitor even though he's been on it for a year Changed to crestor 02/20/22 LDL at goal 67 on 05/28/22   I take care of his dad Justen who owns [employer] and Romilda Joy works for him Had left rib contusion while working 09/01/22 and plain films negative    Wants to have vascular screening bundle and get his lipids checked today  ROS All other systems reviewed and negative except as noted above  Past Medical History:  Diagnosis Date   Adenomatous colon polyp    Duodenitis    Gastritis    GERD (gastroesophageal reflux disease)    IBS (irritable bowel syndrome)    Dr Earlean Shawl   Internal hemorrhoids    Skin cancer 03/2018   L hand, ?[place]    Family History  Problem Relation Age of Onset   Heart disease Father 84       MI, stent age 50   Prostate cancer Father        dx ~ 23   Esophageal cancer Father 32   Hyperlipidemia Sister     Coronary artery disease Paternal Uncle    Colon polyps Mother        also gallstones   Colon cancer Maternal Grandfather    Diabetes Neg Hx    Stomach cancer Neg Hx     Social History   Socioeconomic History   Marital status: Married    Spouse name: Not on file   Number of children: 2   Years of education: Not on file   Highest education level: Not on file  Occupational History   Occupation: plumbing, has his own co    Comment: [employer]  Tobacco Use   Smoking status: Former    Packs/day: 1.00    Types: Cigarettes    Quit date: 05/31/2019    Years since quitting: 3.4   Smokeless tobacco: Never   Tobacco comments:    Hardly ever uses tobacco  Vaping Use   Vaping Use: Never used  Substance and Sexual Activity   Alcohol use: Yes    Alcohol/week: 0.0 standard drinks of alcohol    Comment: socially    Drug use: No   Sexual activity: Not on file  Other Topics Concern   Not on file  Social History Narrative   2 children: 1987, 2008   Household: pt, wife, son   Social Determinants of  Health   Financial Resource Strain: Not on file  Food Insecurity: Not on file  Transportation Needs: Not on file  Physical Activity: Not on file  Stress: Not on file  Social Connections: Not on file  Intimate Partner Violence: Not on file    Past Surgical History:  Procedure Laterality Date   CHOLECYSTECTOMY N/A 01/15/2017   Procedure: LAPAROSCOPIC CHOLECYSTECTOMY;  Surgeon: Ralene Ok, MD;  Location: [facility];  Service: General;  Laterality: N/A;   COLONOSCOPY     CYSTOSCOPY     Dr Risa Grill   HEMORRHOID BANDING     [facility]    MULTIPLE TOOTH EXTRACTIONS     SURGERY ON TESTICLE     at age 46      Current Outpatient Medications:    aspirin EC 81 MG tablet, Take 81 mg by mouth daily., Disp: , Rfl:    famotidine (PEPCID) 20 MG tablet, Take 1 tablet (20 mg total) by mouth 2 (two) times daily., Disp: 180 tablet, Rfl: 3   rosuvastatin (CRESTOR) 10 MG tablet, Take 1 tablet (10 mg  total) by mouth daily., Disp: 90 tablet, Rfl: 3    Physical Exam: Blood pressure 110/82, pulse (!) 56, height '6\' 4"'$  (1.93 m), weight 165 lb (74.8 kg), SpO2 93 %.    Affect appropriate Healthy:  appears stated age 1: normal Neck supple with no adenopathy JVP normal no bruits no thyromegaly Lungs clear with no wheezing and good diaphragmatic motion Heart:  S1/S2 no murmur, no rub, gallop or click PMI normal Abdomen: benighn, BS positve, no tenderness, no AAA no bruit.  No HSM or HJR post cholecystectomy  Distal pulses intact with no bruits No edema Neuro non-focal Skin warm and dry No muscular weakness   Labs:   Lab Results  Component Value Date   WBC 7.7 03/03/2022   HGB 15.7 03/03/2022   HCT 47.2 03/03/2022   MCV 95.1 03/03/2022   PLT 220.0 03/03/2022   No results for input(s): "NA", "K", "CL", "CO2", "BUN", "CREATININE", "CALCIUM", "PROT", "BILITOT", "ALKPHOS", "ALT", "AST", "GLUCOSE" in the last 168 hours.  Invalid input(s): "LABALBU" Lab Results  Component Value Date   CKTOTAL 88 06/11/2020   TROPONINI <0.01 06/15/2016    Lab Results  Component Value Date   CHOL 124 05/28/2022   CHOL 141 02/18/2022   CHOL 117 02/04/2021   Lab Results  Component Value Date   HDL 45 05/28/2022   HDL 45 02/18/2022   HDL 42.10 02/04/2021   Lab Results  Component Value Date   LDLCALC 67 05/28/2022   LDLCALC 80 02/18/2022   LDLCALC 63 02/04/2021   Lab Results  Component Value Date   TRIG 55 05/28/2022   TRIG 82 02/18/2022   TRIG 62.0 02/04/2021   Lab Results  Component Value Date   CHOLHDL 2.8 05/28/2022   CHOLHDL 3.1 02/18/2022   CHOLHDL 3 02/04/2021   No results found for: "LDLDIRECT"    Radiology: No results found.  EKG: 11/09/2022 SR rate 56 normal    ASSESSMENT AND PLAN:   1. Family History Premature CAD:  Elevated calcium score normal ETT 05/21/20 continue statin and ASA LDL 67 Changed lipitor to crestor due to myalgias Update labs and check  vascular bundle screening test at patient request  2. GERD:  Low carb diet continue pepcid/prilosec f/u Dr Hilarie Fredrickson see below   3. Pre Syncope:  ECG has been normal pulse not slow Sounds like esophageal spasm causing Vagal episodes.    Lipid/Liver Vascular screening  bundle   F/U ina year    Signed: Jenkins Rouge 11/09/2022, 8:15 AM

## 2022-11-03 ENCOUNTER — Encounter: Payer: Self-pay | Admitting: Internal Medicine

## 2022-11-03 ENCOUNTER — Ambulatory Visit: Payer: Commercial Managed Care - HMO | Admitting: Internal Medicine

## 2022-11-03 ENCOUNTER — Other Ambulatory Visit: Payer: Commercial Managed Care - HMO

## 2022-11-03 VITALS — BP 118/78 | HR 61 | Ht 76.0 in | Wt 164.2 lb

## 2022-11-03 DIAGNOSIS — K219 Gastro-esophageal reflux disease without esophagitis: Secondary | ICD-10-CM

## 2022-11-03 DIAGNOSIS — R140 Abdominal distension (gaseous): Secondary | ICD-10-CM | POA: Diagnosis not present

## 2022-11-03 DIAGNOSIS — K2100 Gastro-esophageal reflux disease with esophagitis, without bleeding: Secondary | ICD-10-CM

## 2022-11-03 DIAGNOSIS — R109 Unspecified abdominal pain: Secondary | ICD-10-CM | POA: Diagnosis not present

## 2022-11-03 DIAGNOSIS — R1013 Epigastric pain: Secondary | ICD-10-CM

## 2022-11-03 NOTE — Patient Instructions (Signed)
_______________________________________________________  If you are age 59 or older, your body mass index should be between 23-30. Your Body mass index is 19.99 kg/m. If this is out of the aforementioned range listed, please consider follow up with your Primary Care Provider.  If you are age 61 or younger, your body mass index should be between 19-25. Your Body mass index is 19.99 kg/m. If this is out of the aformentioned range listed, please consider follow up with your Primary Care Provider.   ________________________________________________________  The [facility] providers would like to encourage you to use [facility] to communicate with providers for non-urgent requests or questions.  Due to long hold times on the telephone, sending your provider a message by [facility] may be a faster and more efficient way to get a response.  Please allow 48 business hours for a response.  Please remember that this is for non-urgent requests.  _______________________________________________________  Your provider has requested that you go to the basement level for lab work before leaving today. Press "B" on the elevator. The lab is located at the first door on the left as you exit the elevator.   Make sure to take your Pepcid twice a day.  Take 2 Lactaid  tablets with any meal containing lactose.

## 2022-11-03 NOTE — Progress Notes (Signed)
Subjective:    Patient ID: Stanley Petty, male    DOB: 1966-06-09, 57 y.o.   MRN: [id number]  HPI Stanley Petty is a 57 year old male with a history of GERD with esophagitis, H. pylori negative gastroduodenitis, history of adenomatous colon polyps, internal hemorrhoid, family history of esophageal cancer in his father, prior cholecystectomy for gallbladder disease who is here for follow-up.  He is here alone today was last seen in February 2022 by Carl Best, NP  He reports that he continues to have epigastric pressure and at times decreased appetite.  He is not feeling true heartburn.  He has frequent belching.  At times he can feel a sour stomach.  Sometimes at night he will wake up and feel like food is just sitting in his stomach.  He has not been consistent with his famotidine but when he takes it it is definitively helpful.  His bowel movements are daily though at times he can have loose more urgent stools which she calls a "blowout".  If he has such an episode it can take a day or so for bowel habits to return to more normal.  No blood in stool or melena.  He feels that fecal urgency is worse after cholecystectomy.  He worries about his esophagus given his father's history of esophageal cancer.   Review of Systems As per HPI, otherwise negative  Current Medications, Allergies, Past Medical History, Past Surgical History, Family History and Social History were reviewed in [employer] record.    Objective:   Physical Exam BP 118/78 (BP Location: Left Arm, Patient Position: Sitting, Cuff Size: Normal)   Pulse 61   Ht '6\' 4"'$  (1.93 m)   Wt 164 lb 4 oz (74.5 kg)   SpO2 99%   BMI 19.99 kg/m  Gen: awake, alert, NAD HEENT: anicteric  Neuro: nonfocal  CMP     Component Value Date/Time   NA 139 03/03/2022 0829   K 4.4 03/03/2022 0829   CL 105 03/03/2022 0829   CO2 27 03/03/2022 0829   GLUCOSE 92 03/03/2022 0829   BUN 13 03/03/2022 0829   CREATININE  1.04 03/03/2022 0829   CALCIUM 8.8 03/03/2022 0829   PROT 6.4 05/28/2022 0803   ALBUMIN 4.2 05/28/2022 0803   AST 21 05/28/2022 0803   ALT 24 05/28/2022 0803   ALKPHOS 86 05/28/2022 0803   BILITOT 0.5 05/28/2022 0803   GFRNONAA >60 01/13/2017 1040   GFRAA >60 01/13/2017 1040      Latest Ref Rng & Units 03/03/2022    8:29 AM 12/18/2020    9:49 AM 06/11/2020   11:27 AM  CBC  WBC 4.0 - 10.5 K/uL 7.7  8.6  8.5   Hemoglobin 13.0 - 17.0 g/dL 15.7  15.9  15.5   Hematocrit 39.0 - 52.0 % 47.2  47.0  46.5   Platelets 150.0 - 400.0 K/uL 220.0  229.0  228.0          Assessment & Plan:  57 year old male with a history of GERD with esophagitis, H. pylori negative gastroduodenitis, history of adenomatous colon polyps, internal hemorrhoid, family history of esophageal cancer in his father, prior cholecystectomy for gallbladder disease who is here for follow-up.   GERD with esophagitis and dyspepsia --his symptoms are consistent with GERD with overlapping dyspepsia.  With each upper endoscopy he has always had esophagitis and he has not been adherent to acid suppression therapy.  He did not tolerate PPI previously.  He does tolerate  famotidine and it is helpful and so I have encouraged him to use this on a scheduled basis. --Upper endoscopy in May when colonoscopy is repeated for surveillance --Take famotidine 20 mg twice daily; we discussed this medication and I encouraged him to be adherent with it on a daily basis -- Follow-up with me in March  2.  Abdominal pressure and bloating with intermittent loose stool --possible lactose intolerance and I discussed lactose avoidance or using Lactaid tablets to see if this symptom improves. --Celiac panel --Alpha-gal panel --Lactose avoidance or Lactaid tablets with lactose containing foods or milk  3.  History of colon polyps --surveillance colonoscopy due in 2024  Follow-up in March scheduled, discussed procedures and response to the above at that  time  30 minutes total spent today including patient facing time, coordination of care, reviewing medical history/procedures/pertinent radiology studies, and documentation of the encounter.

## 2022-11-07 LAB — ALPHA-GAL PANEL
Allergen, Mutton, f88: 0.22 kU/L — ABNORMAL HIGH
Allergen, Pork, f26: 0.1 kU/L — ABNORMAL HIGH
Beef: 0.23 kU/L — ABNORMAL HIGH
GALACTOSE-ALPHA-1,3-GALACTOSE IGE*: 0.72 kU/L — ABNORMAL HIGH (ref <0.10)

## 2022-11-07 LAB — IGA: Immunoglobulin A: 193 mg/dL (ref 47–310)

## 2022-11-07 LAB — TISSUE TRANSGLUTAMINASE, IGA: (tTG) Ab, IgA: <1 U/mL

## 2022-11-07 LAB — INTERPRETATION:

## 2022-11-09 ENCOUNTER — Encounter: Payer: Self-pay | Admitting: Cardiovascular Disease

## 2022-11-09 ENCOUNTER — Ambulatory Visit: Payer: Commercial Managed Care - HMO | Attending: Cardiovascular Disease | Admitting: Cardiovascular Disease

## 2022-11-09 VITALS — BP 110/82 | HR 56 | Ht 76.0 in | Wt 165.0 lb

## 2022-11-09 DIAGNOSIS — Z8249 Family history of ischemic heart disease and other diseases of the circulatory system: Secondary | ICD-10-CM

## 2022-11-09 DIAGNOSIS — E785 Hyperlipidemia, unspecified: Secondary | ICD-10-CM | POA: Diagnosis not present

## 2022-11-09 NOTE — Patient Instructions (Signed)
Medication Instructions:  Your physician recommends that you continue on your current medications as directed. Please refer to the Current Medication list given to you today.  *If you need a refill on your cardiac medications before your next appointment, please call your pharmacy*  Lab Work: Your physician recommends that you have lab work today- fasting lipid and liver.   If you have labs (blood work) drawn today and your tests are completely normal, you will receive your results only by: [place] (if you have MyChart) OR A paper copy in the mail If you have any lab test that is abnormal or we need to change your treatment, we will call you to review the results.  Testing/Procedures: Your physician recommends you have a vascular screening.  Follow-Up: At [facility], you and your health needs are our priority.  As part of our continuing mission to provide you with exceptional heart care, we have created designated Provider Care Teams.  These Care Teams include your primary Cardiologist (physician) and Advanced Practice Providers (APPs -  Physician Assistants and Nurse Practitioners) who all work together to provide you with the care you need, when you need it.  We recommend signing up for the patient portal called "MyChart".  Sign up information is provided on this After Visit Summary.  MyChart is used to connect with patients for Virtual Visits (Telemedicine).  Patients are able to view lab/test results, encounter notes, upcoming appointments, etc.  Non-urgent messages can be sent to your provider as well.   To learn more about what you can do with MyChart, go to [email or web address].    Your next appointment:   1 year(s)  Provider:   Jenkins Rouge, MD

## 2022-11-09 NOTE — Addendum Note (Signed)
Addended by: Aris Georgia, Erandy Mceachern L on: 11/09/2022 08:50 AM   Modules accepted: Orders

## 2022-11-13 ENCOUNTER — Ambulatory Visit: Payer: Commercial Managed Care - HMO | Attending: Cardiovascular Disease

## 2022-11-13 DIAGNOSIS — Z8249 Family history of ischemic heart disease and other diseases of the circulatory system: Secondary | ICD-10-CM

## 2022-11-13 DIAGNOSIS — E785 Hyperlipidemia, unspecified: Secondary | ICD-10-CM

## 2022-11-13 LAB — LIPID PANEL
Chol/HDL Ratio: 2.4 ratio (ref 0.0–5.0)
Cholesterol, Total: 123 mg/dL (ref 100–199)
HDL: 51 mg/dL (ref >39)
LDL Chol Calc (NIH): 61 mg/dL (ref 0–99)
Triglycerides: 49 mg/dL (ref 0–149)
VLDL Cholesterol Cal: 11 mg/dL (ref 5–40)

## 2022-11-13 LAB — HEPATIC FUNCTION PANEL
ALT: 15 IU/L (ref 0–44)
AST: 17 IU/L (ref 0–40)
Albumin: 4.2 g/dL (ref 3.8–4.9)
Alkaline Phosphatase: 86 IU/L (ref 44–121)
Bilirubin Total: 0.5 mg/dL (ref 0.0–1.2)
Bilirubin, Direct: 0.16 mg/dL (ref 0.00–0.40)
Total Protein: 6.2 g/dL (ref 6.0–8.5)

## 2022-11-24 ENCOUNTER — Ambulatory Visit (HOSPITAL_COMMUNITY)
Admission: RE | Admit: 2022-11-24 | Discharge: 2022-11-24 | Disposition: A | Discharge status: Home / Self Care | Payer: Commercial Managed Care - HMO | Source: Ambulatory Visit | Attending: Cardiology | Admitting: Cardiology

## 2022-11-24 DIAGNOSIS — Z8249 Family history of ischemic heart disease and other diseases of the circulatory system: Secondary | ICD-10-CM

## 2022-11-24 DIAGNOSIS — E785 Hyperlipidemia, unspecified: Secondary | ICD-10-CM

## 2022-12-07 ENCOUNTER — Encounter: Payer: Self-pay | Admitting: *Deleted

## 2022-12-08 ENCOUNTER — Encounter (HOSPITAL_COMMUNITY): Payer: Self-pay | Admitting: Cardiology

## 2023-01-07 ENCOUNTER — Ambulatory Visit: Payer: Commercial Managed Care - HMO | Admitting: Internal Medicine

## 2023-01-07 ENCOUNTER — Encounter: Payer: Self-pay | Admitting: Internal Medicine

## 2023-01-07 VITALS — BP 124/70 | HR 60 | Ht 76.0 in | Wt 167.0 lb

## 2023-01-07 DIAGNOSIS — K602 Anal fissure, unspecified: Secondary | ICD-10-CM | POA: Diagnosis not present

## 2023-01-07 DIAGNOSIS — K2100 Gastro-esophageal reflux disease with esophagitis, without bleeding: Secondary | ICD-10-CM

## 2023-01-07 DIAGNOSIS — Z91018 Allergy to other foods: Secondary | ICD-10-CM | POA: Diagnosis not present

## 2023-01-07 DIAGNOSIS — Z800 Family history of malignant neoplasm of digestive organs: Secondary | ICD-10-CM

## 2023-01-07 DIAGNOSIS — Z86010 Personal history of colonic polyps: Secondary | ICD-10-CM | POA: Diagnosis not present

## 2023-01-07 DIAGNOSIS — R152 Fecal urgency: Secondary | ICD-10-CM

## 2023-01-07 DIAGNOSIS — T781XXA Other adverse food reactions, not elsewhere classified, initial encounter: Secondary | ICD-10-CM | POA: Insufficient documentation

## 2023-01-07 MED ORDER — DILTIAZEM GEL 2 %: CUTANEOUS | 0 refills | Status: DC

## 2023-01-07 MED ORDER — NA SULFATE-K SULFATE-MG SULF 17.5-3.13-1.6 GM/177ML PO SOLN: 1.0000 | Freq: Once | ORAL | 0 refills | Status: AC

## 2023-01-07 NOTE — Patient Instructions (Addendum)
You are scheduled for a consult with Dr Fredderick Phenix at [facility] on 02-02-23 at 8:45am.  You can reach this office at [phone].  Dr VanWinkle's office is located at [facility], [place], [place] [address].  We have sent a prescription for Diltiazem 2% gel to [facility] for you. Using your index finger, you should apply a small amount of medication inside the rectum up to your first knuckle/joint twice daily x 4 weeks.  [facility]'s information is below: Address: [address] [place], [place] [address]  [phone]  *Please DO NOT go directly from our office to pick up this medication! Give the pharmacy 1 day to process the prescription as this is compounded and takes time to make.  You have been scheduled for a colonoscopy. Please follow written instructions given to you at your visit today.  Please pick up your prep supplies at the pharmacy within the next 1-3 days. If you use inhalers (even only as needed), please bring them with you on the day of your procedure.  _______________________________________________________  If your blood pressure at your visit was 140/90 or greater, please contact your primary care physician to follow up on this.  _______________________________________________________  If you are age 24 or older, your body mass index should be between 23-30. Your Body mass index is 20.33 kg/m. If this is out of the aforementioned range listed, please consider follow up with your Primary Care Provider.  If you are age 65 or younger, your body mass index should be between 19-25. Your Body mass index is 20.33 kg/m. If this is out of the aformentioned range listed, please consider follow up with your Primary Care Provider.   ________________________________________________________  The [facility] providers would like to encourage you to use [facility] to communicate with providers for non-urgent requests or  questions.  Due to long hold times on the telephone, sending your provider a message by [facility] may be a faster and more efficient way to get a response.  Please allow 48 business hours for a response.  Please remember that this is for non-urgent requests.  _______________________________________________________

## 2023-01-07 NOTE — Progress Notes (Signed)
Subjective:    Patient ID: Stanley Petty, male    DOB: 1966/04/08, 57 y.o.   MRN: [id number]  HPI [name] is a 57 year old male with a history of GERD with esophagitis, H. pylori negative gastroduodenitis, alpha-gal allergy, history of adenomatous colon polyps, internal hemorrhoid, family history of esophageal cancer in his father, prior cholecystectomy for gallbladder disease who is here for follow-up.  He was last seen in January 2024.  He is here alone today.  After his last visit he had an alpha-gal panel which was positive.  His galactose alpha 1, 3 IgE antibody was 0.72 which is in the moderate level. His TTG antibody was negative and his hepatic function panel was normal.  He has for the most part strictly avoid beef and pork.  He will occasionally eat sausage gravy on Saturday but he does not eat the sausage bits that are in the gravy.  He has much less abdominal pain and bloating and stomach upset.  He has not yet seen Dr. Harold Hedge.  He has back-and-forth bowel movements.  Never really feels constipated though if he drinks only coffee and waits late to eat breakfast he can have urgent loose stools after his first meal.  If he eats breakfast he tends to have more regular bowel movements.  He is using a bidet which he feels is helpful.  He does feel hemorrhoidal prolapse symptoms which impairs his ability to pass gas at times.  For the last week or so he has noticed some pain and red blood after passing stool.  It is a stinging and throbbing type pain at the anus.   Review of Systems As per HPI, otherwise negative  Current Medications, Allergies, Past Medical History, Past Surgical History, Family History and Social History were reviewed in [employer] record.    Objective:   Physical Exam BP 124/70   Pulse 60   Ht '6\' 4"'$  (1.93 m)   Wt 167 lb (75.8 kg)   BMI 20.33 kg/m  Gen: awake, alert, NAD HEENT: anicteric  Neuro: nonfocal     Latest Ref Rng &  Units 03/03/2022    8:29 AM 12/18/2020    9:49 AM 06/11/2020   11:27 AM  CBC  WBC 4.0 - 10.5 K/uL 7.7  8.6  8.5   Hemoglobin 13.0 - 17.0 g/dL 15.7  15.9  15.5   Hematocrit 39.0 - 52.0 % 47.2  47.0  46.5   Platelets 150.0 - 400.0 K/uL 220.0  229.0  228.0    CMP     Component Value Date/Time   NA 139 03/03/2022 0829   K 4.4 03/03/2022 0829   CL 105 03/03/2022 0829   CO2 27 03/03/2022 0829   GLUCOSE 92 03/03/2022 0829   BUN 13 03/03/2022 0829   CREATININE 1.04 03/03/2022 0829   CALCIUM 8.8 03/03/2022 0829   PROT 6.2 11/13/2022 0827   ALBUMIN 4.2 11/13/2022 0827   AST 17 11/13/2022 0827   ALT 15 11/13/2022 0827   ALKPHOS 86 11/13/2022 0827   BILITOT 0.5 11/13/2022 0827   GFRNONAA >60 01/13/2017 1040   GFRAA >60 01/13/2017 1040         Assessment & Plan:  57 year old male with a history of GERD with esophagitis, H. pylori negative gastroduodenitis, alpha-gal allergy, history of adenomatous colon polyps, internal hemorrhoid, family history of esophageal cancer in his father, prior cholecystectomy for gallbladder disease who is here for follow-up.   Alpha gal allergy --moderate level antibody.  We discussed how allergies work and that his immune system may be more primed and have a robust response if he reintroduces beef or pork.  I cautioned him against doing so and trying to strictly avoid beef and pork of all kind.  He is certainly feeling better with new diet. --Referral back to Dr. Harold Hedge to discuss alpha-gal allergy; patient is interested in this appointment --Avoid all beef and pork; I told him really avoid eating any animal on 4 legs  2.  GERD with history of esophagitis/family history of esophageal cancer --symptoms have improved with beef and pork avoidance.  Continues Pepcid 20 mg twice daily --EGD in the [facility] --Pepcid 20 mg twice daily  3.  History of colon polyps --surveillance colonoscopy due in May. --Colonoscopy in the [place]  4.  Anal fissure --classic  symptoms. -- Diltiazem gel 2% twice daily for 3 to 4 weeks, continue 1 week longer after symptoms completely abate  5.  Fecal urgency --likely related to eating a meal after a prolonged fast in the setting of cholecystectomy.  I encouraged him to eat more regular 3 meals a day.  I also feel that he would benefit from Metamucil -- Metamucil 2 teaspoons to 2 tablespoons once daily  We discussed the risk, benefits and alternatives to upper and lower endoscopy and he is agreeable and wishes to proceed  40 minutes total spent today including patient facing time, coordination of care, reviewing medical history/procedures/pertinent radiology studies, and documentation of the encounter.

## 2023-01-08 ENCOUNTER — Telehealth: Payer: Self-pay | Admitting: *Deleted

## 2023-01-08 NOTE — Telephone Encounter (Signed)
We have received a note from Dr Harold Hedge office indicating that patient has been scheduled for 02/02/23 at 8:45 am. Patient was contacted by their office and advised.

## 2023-03-05 ENCOUNTER — Ambulatory Visit (INDEPENDENT_AMBULATORY_CARE_PROVIDER_SITE_OTHER): Payer: Commercial Managed Care - HMO | Admitting: Internal Medicine

## 2023-03-05 ENCOUNTER — Encounter: Payer: Self-pay | Admitting: Internal Medicine

## 2023-03-05 VITALS — BP 116/64 | HR 63 | Temp 97.6°F | Resp 16 | Ht 76.0 in | Wt 165.5 lb

## 2023-03-05 DIAGNOSIS — E7800 Pure hypercholesterolemia, unspecified: Secondary | ICD-10-CM | POA: Diagnosis not present

## 2023-03-05 DIAGNOSIS — Z122 Encounter for screening for malignant neoplasm of respiratory organs: Secondary | ICD-10-CM

## 2023-03-05 DIAGNOSIS — Z0000 Encounter for general adult medical examination without abnormal findings: Secondary | ICD-10-CM

## 2023-03-05 LAB — CBC WITH DIFFERENTIAL/PLATELET
Basophils Absolute: 0.1 10*3/uL (ref 0.0–0.1)
Basophils Relative: 0.8 % (ref 0.0–3.0)
Eosinophils Absolute: 0.3 10*3/uL (ref 0.0–0.7)
Eosinophils Relative: 4.2 % (ref 0.0–5.0)
HCT: 46.8 % (ref 39.0–52.0)
Hemoglobin: 15.9 g/dL (ref 13.0–17.0)
Lymphocytes Relative: 34.2 % (ref 12.0–46.0)
Lymphs Abs: 2.4 10*3/uL (ref 0.7–4.0)
MCHC: 34 g/dL (ref 30.0–36.0)
MCV: 95.1 fl (ref 78.0–100.0)
Monocytes Absolute: 0.7 10*3/uL (ref 0.1–1.0)
Monocytes Relative: 10.1 % (ref 3.0–12.0)
Neutro Abs: 3.5 10*3/uL (ref 1.4–7.7)
Neutrophils Relative %: 50.7 % (ref 43.0–77.0)
Platelets: 221 10*3/uL (ref 150.0–400.0)
RBC: 4.93 Mil/uL (ref 4.22–5.81)
RDW: 13.8 % (ref 11.5–15.5)
WBC: 7 10*3/uL (ref 4.0–10.5)

## 2023-03-05 LAB — BASIC METABOLIC PANEL
BUN: 15 mg/dL (ref 6–23)
CO2: 27 mEq/L (ref 19–32)
Calcium: 9 mg/dL (ref 8.4–10.5)
Chloride: 105 mEq/L (ref 96–112)
Creatinine, Ser: 1.04 mg/dL (ref 0.40–1.50)
GFR: 78.72 mL/min (ref >60.00)
Glucose, Bld: 84 mg/dL (ref 70–99)
Potassium: 4.3 mEq/L (ref 3.5–5.1)
Sodium: 138 mEq/L (ref 135–145)

## 2023-03-05 LAB — PSA: PSA: 0.46 ng/mL (ref 0.10–4.00)

## 2023-03-05 NOTE — Progress Notes (Signed)
Subjective:    Patient ID: Stanley Petty, male    DOB: [birth year], 57 y.o.   MRN: [id number]  DOS:  03/05/2023 Type of visit - description: cpx  Here for CPX Since the last visit he is doing well. Occasional LUTS.   Review of Systems  Other than above, a 14 point review of systems is negative     Past Medical History:  Diagnosis Date   Adenomatous colon polyp    Allergy to alpha-gal    Duodenitis    Gastritis    GERD (gastroesophageal reflux disease)    IBS (irritable bowel syndrome)    Dr Kinnie Scales   Internal hemorrhoids    Skin cancer 03/2018   L hand, ?BCC    Past Surgical History:  Procedure Laterality Date   CHOLECYSTECTOMY N/A 01/15/2017   Procedure: LAPAROSCOPIC CHOLECYSTECTOMY;  Surgeon: Axel Filler, MD;  Location: [facility];  Service: General;  Laterality: N/A;   COLONOSCOPY     CYSTOSCOPY     Dr Isabel Caprice   HEMORRHOID BANDING     [place]    MULTIPLE TOOTH EXTRACTIONS     SURGERY ON TESTICLE     at age 65   Social History   Socioeconomic History   Marital status: Married    Spouse name: Not on file   Number of children: 2   Years of education: Not on file   Highest education level: Not on file  Occupational History   Occupation: plumbing, has his own co    Comment: [employer]  Tobacco Use   Smoking status: Former    Packs/day: 1    Types: Cigarettes    Quit date: 05/31/2019    Years since quitting: 3.7   Smokeless tobacco: Never   Tobacco comments:       Vaping Use   Vaping Use: Never used  Substance and Sexual Activity   Alcohol use: Yes    Alcohol/week: 0.0 standard drinks of alcohol    Comment: socially    Drug use: No   Sexual activity: Not on file  Other Topics Concern   Not on file  Social History Narrative   2 children: 1987, 2008   Household: pt, wife, son   Social Determinants of Corporate investment banker Strain: Not on file  Food Insecurity: Not on file  Transportation Needs: Not on file  Physical Activity: Not on  file  Stress: Not on file  Social Connections: Not on file  Intimate Partner Violence: Not on file     Current Outpatient Medications  Medication Instructions   aspirin EC 81 mg, Oral, Daily   diltiazem 2 % GEL Using your index finger, apply a small amount of medication inside the rectum up to your first knuckle/joint twice daily x 4 weeks.   famotidine (PEPCID) 20 mg, Oral, 2 times daily   rosuvastatin (CRESTOR) 10 mg, Oral, Daily       Objective:   Physical Exam BP 116/64   Pulse 63   Temp 97.6 F (36.4 C) (Oral)   Resp 16   Ht 6\' 4"  (1.93 m)   Wt 165 lb 8 oz (75.1 kg)   SpO2 98%   BMI 20.15 kg/m  General: Well developed, NAD, BMI noted Neck: No  thyromegaly  HEENT:  Normocephalic . Face symmetric, atraumatic Lungs:  CTA B Normal respiratory effort, no intercostal retractions, no accessory muscle use. Heart: RRR,  no murmur.  Abdomen:  Not distended, soft, non-tender. No rebound or rigidity.  Lower extremities: no pretibial edema bilaterally  Skin: Exposed areas without rash. Not pale. Not jaundice Neurologic:  alert & oriented X3.  Speech normal, gait appropriate for age and unassisted Strength symmetric and appropriate for age.  Psych: Cognition and judgment appear intact.  Cooperative with normal attention span and concentration.  Behavior appropriate. No anxious or depressed appearing.     Assessment    Assessment IBS  Hemorrhoids, h/o banding Skin cancer , BCC? L hand 2019 2012: Abdominal pain, [place] 2 liver lesions; HIDA: decreased EF  +FH CAD -ETT normal 10-2014 -Coronary calcium score 300 (04-2020), high -ETT 04/2020 neg Alpha gal syndrome   PLAN Here for CPX High cholesterol: on Crestor , Last LDL pretty good. LUTS: Symptoms at baseline, he is thinking about going back to urology for a checkup. Alpha gal syndrome: dx recently.  Encouraged to have an EpiPen. RTC 1 year

## 2023-03-05 NOTE — Patient Instructions (Addendum)
Vaccines I recommend: Shingrix (shingles) COVID-vaccine Flu shot every fall  Will schedule a CT of your chest for lung cancer screening.  If they do not call you within the next few days please let us know  GO TO THE LAB : Get the blood work     GO TO THE FRONT DESK, PLEASE SCHEDULE YOUR APPOINTMENTS Come back for   a physical exam in 1 year    "Health Care Power of attorney" ,  "Living will" (Advance care planning documents)  If you already have a living will or healthcare power of attorney, is recommended you bring the copy to be scanned in your chart.   The document will be available to all the doctors you see in the system.  Advance care planning is a process that supports adults in  understanding and sharing their preferences regarding future medical care.  The patient's preferences are recorded in documents called Advance Directives and the can be modified at any time while the patient is in full mental capacity.   If you don't have one, please consider create one.      More information at: [email or web address]

## 2023-03-05 NOTE — Assessment & Plan Note (Signed)
-  Td  2023 -Vaccines I recommended:  , Shingrix, COVID-vaccine, flu shot every fall --Prostate cancer screening: Saw urology 02/2022, has minimal LUTS at this point, DRE at that time was okay except for increased prostate size.  Check PSA. - CCS: Cscope 10-2011, 1 polyp, Cscope 2017, Cscope 02/2020 , next scheduled. - Tobacco: smoke from 1980 to 2020, ~ 1 ppd.  Currently denies cough or wheezing.  Option of lung cancer screening discussed, he would like to proceed.  -Labs reviewed, check BMP CBC PSA -Advance directive discussed

## 2023-03-05 NOTE — Assessment & Plan Note (Signed)
Here for CPX High cholesterol: on Crestor , Last LDL pretty good. LUTS: Symptoms at baseline, he is thinking about going back to urology for a checkup. Alpha gal syndrome: dx recently.  Encouraged to have an EpiPen. RTC 1 year

## 2023-04-05 ENCOUNTER — Ambulatory Visit (AMBULATORY_SURGERY_CENTER): Payer: Commercial Managed Care - HMO | Admitting: Internal Medicine

## 2023-04-05 ENCOUNTER — Encounter: Payer: Self-pay | Admitting: Internal Medicine

## 2023-04-05 VITALS — BP 135/79 | HR 55 | Temp 97.7°F | Resp 18 | Ht 76.0 in | Wt 167.0 lb

## 2023-04-05 DIAGNOSIS — D122 Benign neoplasm of ascending colon: Secondary | ICD-10-CM

## 2023-04-05 DIAGNOSIS — Z800 Family history of malignant neoplasm of digestive organs: Secondary | ICD-10-CM

## 2023-04-05 DIAGNOSIS — K639 Disease of intestine, unspecified: Secondary | ICD-10-CM

## 2023-04-05 DIAGNOSIS — Z09 Encounter for follow-up examination after completed treatment for conditions other than malignant neoplasm: Secondary | ICD-10-CM

## 2023-04-05 DIAGNOSIS — Z86010 Personal history of colonic polyps: Secondary | ICD-10-CM | POA: Diagnosis not present

## 2023-04-05 DIAGNOSIS — K2100 Gastro-esophageal reflux disease with esophagitis, without bleeding: Secondary | ICD-10-CM | POA: Diagnosis not present

## 2023-04-05 DIAGNOSIS — K3189 Other diseases of stomach and duodenum: Secondary | ICD-10-CM | POA: Diagnosis not present

## 2023-04-05 DIAGNOSIS — R140 Abdominal distension (gaseous): Secondary | ICD-10-CM

## 2023-04-05 DIAGNOSIS — R1013 Epigastric pain: Secondary | ICD-10-CM

## 2023-04-05 DIAGNOSIS — K6389 Other specified diseases of intestine: Secondary | ICD-10-CM | POA: Diagnosis not present

## 2023-04-05 MED ORDER — SODIUM CHLORIDE 0.9 % IV SOLN: 500.0000 mL | Freq: Once | INTRAVENOUS | Status: DC

## 2023-04-05 NOTE — Progress Notes (Signed)
Medical record updated 

## 2023-04-05 NOTE — Progress Notes (Signed)
GASTROENTEROLOGY PROCEDURE H&P NOTE   Primary Care Physician: Wanda Plump, MD    Reason for Procedure:  GERD, family history of esophageal cancer and history of colon polyps  Plan:    EGD/colonoscopy  Patient is appropriate for endoscopic procedure(s) in the ambulatory ([facility]) setting.  The nature of the procedure, as well as the risks, benefits, and alternatives were carefully and thoroughly reviewed with the patient. Ample time for discussion and questions allowed. The patient understood, was satisfied, and agreed to proceed.     HPI: Stanley Petty is a 57 y.o. male who presents for EGD/colonoscopy.  Medical history as below.  Tolerated the prep.  No recent chest pain or shortness of breath.  No abdominal pain today.  Past Medical History:  Diagnosis Date   Adenomatous colon polyp    Allergy to alpha-gal    Duodenitis    Gastritis    GERD (gastroesophageal reflux disease)    IBS (irritable bowel syndrome)    Dr Kinnie Scales   Internal hemorrhoids    Skin cancer 03/2018   L hand, ?BCC    Past Surgical History:  Procedure Laterality Date   CHOLECYSTECTOMY N/A 01/15/2017   Procedure: LAPAROSCOPIC CHOLECYSTECTOMY;  Surgeon: Axel Filler, MD;  Location: [facility];  Service: General;  Laterality: N/A;   COLONOSCOPY     CYSTOSCOPY     Dr Isabel Caprice   HEMORRHOID BANDING     [place]    MULTIPLE TOOTH EXTRACTIONS     SURGERY ON TESTICLE     at age 56    Prior to Admission medications   Medication Sig Start Date End Date Taking? Authorizing Provider  famotidine (PEPCID) 20 MG tablet Take 1 tablet (20 mg total) by mouth 2 (two) times daily. 09/11/22  Yes Paz, Nolon Rod, MD  rosuvastatin (CRESTOR) 10 MG tablet Take 1 tablet (10 mg total) by mouth daily. 02/20/22  Yes Wendall Stade, MD  aspirin EC 81 MG tablet Take 81 mg by mouth daily.    [provider]  diltiazem 2 % GEL Using your index finger, apply a small amount of medication inside the rectum up to your first  knuckle/joint twice daily x 4 weeks. Patient not taking: Reported on 03/05/2023 01/07/23   Beverley Fiedler, MD  EPINEPHrine 0.3 mg/0.3 mL IJ SOAJ injection SMARTSIG:Injection IM As Directed 02/02/23   [provider]  metroNIDAZOLE (METROGEL) 0.75 % gel SMARTSIG:Sparingly Topical Twice Daily 03/18/23   [provider]    Current Outpatient Medications  Medication Sig Dispense Refill   famotidine (PEPCID) 20 MG tablet Take 1 tablet (20 mg total) by mouth 2 (two) times daily. 180 tablet 3   rosuvastatin (CRESTOR) 10 MG tablet Take 1 tablet (10 mg total) by mouth daily. 90 tablet 3   aspirin EC 81 MG tablet Take 81 mg by mouth daily.     diltiazem 2 % GEL Using your index finger, apply a small amount of medication inside the rectum up to your first knuckle/joint twice daily x 4 weeks. (Patient not taking: Reported on 03/05/2023) 30 g 0   EPINEPHrine 0.3 mg/0.3 mL IJ SOAJ injection SMARTSIG:Injection IM As Directed     metroNIDAZOLE (METROGEL) 0.75 % gel SMARTSIG:Sparingly Topical Twice Daily     Current Facility-Administered Medications  Medication Dose Route Frequency Provider Last Rate Last Admin   0.9 %  sodium chloride infusion  500 mL Intravenous Once Olon Russ, Carie Caddy, MD        Allergies as of  04/05/2023 - Review Complete 04/05/2023  Allergen Reaction Noted   Alpha-gal  01/07/2023   Vicodin [hydrocodone-acetaminophen] Nausea And Vomiting 01/13/2017    Family History  Problem Relation Age of Onset   Colon polyps Mother        also gallstones   Heart disease Father 39       MI, stent age 51   Prostate cancer Father        dx ~ 15   Esophageal cancer Father 63   Hyperlipidemia Sister    Coronary artery disease Paternal Uncle    Colon cancer Maternal Grandfather    Diabetes Neg Hx    Stomach cancer Neg Hx    Rectal cancer Neg Hx     Social History   Socioeconomic History   Marital status: Married    Spouse name: Not on file   Number of children: 2   Years of  education: Not on file   Highest education level: Not on file  Occupational History   Occupation: plumbing, has his own co    Comment: [employer]  Tobacco Use   Smoking status: Former    Packs/day: 1    Types: Cigarettes    Quit date: 05/31/2019    Years since quitting: 3.8   Smokeless tobacco: Never   Tobacco comments:       Vaping Use   Vaping Use: Never used  Substance and Sexual Activity   Alcohol use: Yes    Alcohol/week: 0.0 standard drinks of alcohol    Comment: socially    Drug use: No   Sexual activity: Yes  Other Topics Concern   Not on file  Social History Narrative   2 children: 1987, 2008   Household: pt, wife, son   Social Determinants of Corporate investment banker Strain: Not on file  Food Insecurity: Not on file  Transportation Needs: Not on file  Physical Activity: Not on file  Stress: Not on file  Social Connections: Not on file  Intimate Partner Violence: Not on file    Physical Exam: Vital signs in last 24 hours: @BP  138/82   Pulse (!) 48   Temp 97.7 F (36.5 C) (Temporal)   Resp (!) 9   Ht 6\' 4"  (1.93 m)   Wt 167 lb (75.8 kg)   SpO2 100%   BMI 20.33 kg/m  GEN: NAD EYE: Sclerae anicteric ENT: MMM CV: Non-tachycardic Pulm: CTA b/l GI: Soft, NT/ND NEURO:  Alert & Oriented x 3   Erick Blinks, MD [facility]  04/05/2023 8:04 AM

## 2023-04-05 NOTE — Op Note (Signed)
[facility] Patient Name: Stanley Petty Procedure Date: 04/05/2023 7:45 AM MRN: [id number] Endoscopist: Beverley Fiedler , MD, [id number] Age: 57 Referring MD:  Date of Birth: [birth year] Gender: Male Account #: [id number] Procedure:                Upper GI endoscopy Indications:              Epigastric abdominal pain, Gastro-esophageal reflux                            disease, Abdominal bloating, Family history of                            esophageal cancer, recent dx alpha-gal, currently                            famotidine 20 mg BID Medicines:                Monitored Anesthesia Care Procedure:                Pre-Anesthesia Assessment:                           - Prior to the procedure, a History and Physical                            was performed, and patient medications and                            allergies were reviewed. The patient's tolerance of                            previous anesthesia was also reviewed. The risks                            and benefits of the procedure and the sedation                            options and risks were discussed with the patient.                            All questions were answered, and informed consent                            was obtained. Prior Anticoagulants: The patient has                            taken no anticoagulant or antiplatelet agents. ASA                            Grade Assessment: II - A patient with mild systemic                            disease. After reviewing the risks and benefits,  the patient was deemed in satisfactory condition to                            undergo the procedure.                           After obtaining informed consent, the endoscope was                            passed under direct vision. Throughout the                            procedure, the patient's blood pressure, pulse, and                            oxygen saturations were monitored  continuously. The                            GIF [id number] #[id number] was introduced through the                            mouth, and advanced to the second part of duodenum.                            The upper GI endoscopy was accomplished without                            difficulty. The patient tolerated the procedure                            well. Scope In: Scope Out: Findings:                 The examined esophagus was normal.                           A 2 cm hiatal hernia was present.                           The gastroesophageal flap valve was visualized                            endoscopically and classified as Hill Grade III                            (minimal fold, loose to endoscope, hiatal hernia                            likely).                           Patchy mildly erythematous mucosa was found in the                            gastric antrum. Biopsies were taken with a cold  forceps for histology and Helicobacter pylori                            testing.                           Diffuse mildly erythematous mucosa was found in the                            duodenal bulb.                           The second portion of the duodenum was normal.                            Biopsies for histology were taken with a cold                            forceps for evaluation of celiac disease. Complications:            No immediate complications. Estimated Blood Loss:     Estimated blood loss was minimal. Impression:               - Normal esophagus.                           - 2 cm hiatal hernia.                           - Erythematous mucosa in the antrum. Biopsied.                           - Erythematous mucosa in duodenal bulb.                           - Normal second portion of the duodenum. Biopsied. Recommendation:           - Patient has a contact number available for                            emergencies. The signs and symptoms of  potential                            delayed complications were discussed with the                            patient. Return to normal activities tomorrow.                            Written discharge instructions were provided to the                            patient.                           - Resume previous diet (beef and pork-free diet).                           -  Continue present medications.                           - Await pathology results.                           - See the other procedure note for documentation of                            additional recommendations. Beverley Fiedler, MD 04/05/2023 8:40:55 AM This report has been signed electronically.

## 2023-04-05 NOTE — Patient Instructions (Addendum)
-Handout on polyps,hemorrhoids,hiatal hernia, provided -await pathology results -repeat colonoscopy for surveillance recommended. Date to be determined when pathology result become available   -Continue present medications    YOU HAD AN ENDOSCOPIC PROCEDURE TODAY AT THE [facility]:   Refer to the procedure report that was given to you for any specific questions about what was found during the examination.  If the procedure report does not answer your questions, please call your gastroenterologist to clarify.  If you requested that your care partner not be given the details of your procedure findings, then the procedure report has been included in a sealed envelope for you to review at your convenience later.  YOU SHOULD EXPECT: Some feelings of bloating in the abdomen. Passage of more gas than usual.  Walking can help get rid of the air that was put into your GI tract during the procedure and reduce the bloating. If you had a lower endoscopy (such as a colonoscopy or flexible sigmoidoscopy) you may notice spotting of blood in your stool or on the toilet paper. If you underwent a bowel prep for your procedure, you may not have a normal bowel movement for a few days.  Please Note:  You might notice some irritation and congestion in your nose or some drainage.  This is from the oxygen used during your procedure.  There is no need for concern and it should clear up in a day or so.  SYMPTOMS TO REPORT IMMEDIATELY:  Following lower endoscopy (colonoscopy or flexible sigmoidoscopy):  Excessive amounts of blood in the stool  Significant tenderness or worsening of abdominal pains  Swelling of the abdomen that is new, acute  Fever of 100F or higher  Following upper endoscopy (EGD)  Vomiting of blood or coffee ground material  New chest pain or pain under the shoulder blades  Painful or persistently difficult swallowing  New shortness of breath  Fever of 100F or higher  Black,  tarry-looking stools  For urgent or emergent issues, a gastroenterologist can be reached at any hour by calling [phone]. Do not use MyChart messaging for urgent concerns.    DIET:  We do recommend a small meal at first, but then you may proceed to your regular diet.  Drink plenty of fluids but you should avoid alcoholic beverages for 24 hours.  ACTIVITY:  You should plan to take it easy for the rest of today and you should NOT DRIVE or use heavy machinery until tomorrow (because of the sedation medicines used during the test).    FOLLOW UP: Our staff will call the number listed on your records the next business day following your procedure.  We will call around 7:15- 8:00 am to check on you and address any questions or concerns that you may have regarding the information given to you following your procedure. If we do not reach you, we will leave a message.     If any biopsies were taken you will be contacted by phone or by letter within the next 1-3 weeks.  Please call us at [phone] if you have not heard about the biopsies in 3 weeks.    SIGNATURES/CONFIDENTIALITY: You and/or your care partner have signed paperwork which will be entered into your electronic medical record.  These signatures attest to the fact that that the information above on your After Visit Summary has been reviewed and is understood.  Full responsibility of the confidentiality of this discharge information lies with you and/or your care-partner.

## 2023-04-05 NOTE — Progress Notes (Signed)
Uneventful anesthetic. Report to pacu rn. Vss. Care resumed by rn. 

## 2023-04-05 NOTE — Progress Notes (Signed)
Called to room to assist during endoscopic procedure.  Patient ID and intended procedure confirmed with present staff. Received instructions for my participation in the procedure from the performing physician.  

## 2023-04-05 NOTE — Op Note (Signed)
[facility] Patient Name: Stanley Petty Procedure Date: 04/05/2023 7:44 AM MRN: [id number] Endoscopist: Beverley Fiedler , MD, [id number] Age: 57 Referring MD:  Date of Birth: [birth year] Gender: Male Account #: [id number] Procedure:                Colonoscopy Indications:              Surveillance: Personal history of adenomatous                            polyps on last colonoscopy 3 years ago; hx of                            adenoma > 1 cm Medicines:                Monitored Anesthesia Care Procedure:                Pre-Anesthesia Assessment:                           - Prior to the procedure, a History and Physical                            was performed, and patient medications and                            allergies were reviewed. The patient's tolerance of                            previous anesthesia was also reviewed. The risks                            and benefits of the procedure and the sedation                            options and risks were discussed with the patient.                            All questions were answered, and informed consent                            was obtained. Prior Anticoagulants: The patient has                            taken no anticoagulant or antiplatelet agents. ASA                            Grade Assessment: II - A patient with mild systemic                            disease. After reviewing the risks and benefits,                            the patient was deemed in satisfactory condition to  undergo the procedure.                           After obtaining informed consent, the colonoscope                            was passed under direct vision. Throughout the                            procedure, the patient's blood pressure, pulse, and                            oxygen saturations were monitored continuously. The                            Olympus SN [id number] was introduced through the anus                             and advanced to the terminal ileum. The colonoscopy                            was performed without difficulty. The patient                            tolerated the procedure well. The quality of the                            bowel preparation was excellent. Scope In: 8:19:16 AM Scope Out: 8:34:28 AM Scope Withdrawal Time: 0 hours 13 minutes 14 seconds  Total Procedure Duration: 0 hours 15 minutes 12 seconds  Findings:                 The digital rectal exam was normal.                           The terminal ileum appeared normal.                           A 4 mm polyp was found in the proximal ascending                            colon. The polyp was sessile. The polyp was removed                            with a cold snare. Resection and retrieval were                            complete.                           A diffuse area of mildly erythematous mucosa was                            found in the sigmoid colon. This was biopsied with  a cold forceps for histology.                           Internal hemorrhoids were found during                            retroflexion. The hemorrhoids were medium-sized. Complications:            No immediate complications. Estimated Blood Loss:     Estimated blood loss was minimal. Impression:               - The examined portion of the ileum was normal.                           - One 4 mm polyp in the proximal ascending colon,                            removed with a cold snare. Resected and retrieved.                           - Erythematous mucosa in the sigmoid colon.                            Biopsied.                           - Internal hemorrhoids. Recommendation:           - Patient has a contact number available for                            emergencies. The signs and symptoms of potential                            delayed complications were discussed with the                             patient. Return to normal activities tomorrow.                            Written discharge instructions were provided to the                            patient.                           - Resume previous diet.                           - Continue present medications.                           - Await pathology results.                           - Repeat colonoscopy is recommended for  surveillance. The colonoscopy date will be                            determined after pathology results from today's                            exam become available for review. Beverley Fiedler, MD 04/05/2023 8:44:49 AM This report has been signed electronically.

## 2023-04-06 ENCOUNTER — Telehealth: Payer: Self-pay

## 2023-04-06 NOTE — Telephone Encounter (Signed)
  Follow up Call-     04/05/2023    7:31 AM  Call back number  Post procedure Call Back phone  # [phone]  Permission to leave phone message Yes     Patient questions:  Do you have a fever, pain , or abdominal swelling? No. Pain Score  1 *  Have you tolerated food without any problems? Yes.    Have you been able to return to your normal activities? Yes.    Do you have any questions about your discharge instructions: Diet   No. Medications  No. Follow up visit  No.  Do you have questions or concerns about your Care? No.  Actions: * If pain score is 4 or above: No action needed, pain <4.  Pt states slight pain to right side but going to work and feels fine otherwise, instruct pt to call if pain worsens or any other concerning sx, pt verb understanding.

## 2023-04-14 ENCOUNTER — Encounter: Payer: Self-pay | Admitting: Internal Medicine

## 2023-04-16 ENCOUNTER — Other Ambulatory Visit: Payer: Self-pay | Admitting: Cardiovascular Disease

## 2023-04-16 ENCOUNTER — Encounter: Payer: Self-pay | Admitting: Cardiovascular Disease

## 2023-05-04 ENCOUNTER — Telehealth: Payer: Self-pay | Admitting: Internal Medicine

## 2023-05-04 NOTE — Telephone Encounter (Signed)
PT wife is calling because PT still is having issues with diarrhea and weight loss. Requesting call back

## 2023-05-05 NOTE — Telephone Encounter (Signed)
Called patient x's 2 today in reference to symptoms his wife had called about earlier this morning. No answer, LMTCB.

## 2023-05-05 NOTE — Telephone Encounter (Signed)
Called patient in reference to issues his wife called about, symptoms patient is having; unable to speak with patient or wife at this time. LMTCB.

## 2023-12-03 ENCOUNTER — Ambulatory Visit: Payer: No Typology Code available for payment source | Admitting: Family

## 2023-12-03 VITALS — BP 137/89 | HR 51 | Temp 97.7°F | Resp 16 | Ht 76.0 in | Wt 165.0 lb

## 2023-12-03 DIAGNOSIS — R221 Localized swelling, mass and lump, neck: Secondary | ICD-10-CM | POA: Diagnosis not present

## 2023-12-03 MED ORDER — MELOXICAM 7.5 MG PO TABS: 7.5000 mg | ORAL_TABLET | Freq: Every day | ORAL | 0 refills | Status: DC

## 2023-12-03 NOTE — Progress Notes (Signed)
 Subjective:     Patient ID: Stanley Petty, male    DOB: 1966/04/26, 58 y.o.   MRN: [id number]  Chief Complaint  Patient presents with   Neck Pain    Complains of pain on left side of neck    Neck Pain     Discussed the use of AI scribe software for clinical note transcription with the patient, who gave verbal consent to proceed.  History of Present Illness   Stanley Petty is a 58 year old male who presents with a sore and swollen area on the left side of his neck. this seemed to begin after he lifted some heavy equipment at work.  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 No recent illness or infection reported.  He has experienced soreness and swelling on the left side of his neck since Monday, following the lifting of a heavy piece of equipment at work. He describes a 'little knot' in the area, which initially seemed to decrease in size overnight but then swelled again during the day. The swelling is not associated with ear, throat, or nasal symptoms. The area feels 'a little sensitive' and 'thicker' but not painful.  He has taken 200 mg of ibuprofen on Wednesday and one dose on Thursday, with no other medications used for this issue.  He is concerned due to his family history of cancer. His wife is a two-time breast cancer survivor, and his father has had esophagus and prostate cancer.          Health Maintenance Due  Topic Date Due   Zoster Vaccines- Shingrix (1 of 2) Never done    Past Medical History:  Diagnosis Date   Adenomatous colon polyp    Allergy to alpha-gal    Duodenitis    Gastritis    GERD (gastroesophageal reflux disease)    IBS (irritable bowel syndrome)    Dr Luis   Internal hemorrhoids    Skin cancer 03/2018   L hand, ?[facility]    Past Surgical History:  Procedure Laterality Date   CHOLECYSTECTOMY N/A 01/15/2017   Procedure: LAPAROSCOPIC CHOLECYSTECTOMY;  Surgeon: Lynda Leos, MD;  Location: [facility];  Service: General;   Laterality: N/A;   COLONOSCOPY     CYSTOSCOPY     Dr Alline   HEMORRHOID BANDING     [facility]    MULTIPLE TOOTH EXTRACTIONS     SURGERY ON TESTICLE     at age 58    Family History  Problem Relation Age of Onset   Colon polyps Mother        also gallstones   Heart disease Father 50       MI, stent age 17   Prostate cancer Father        dx ~ 88   Esophageal cancer Father 52   Hyperlipidemia Sister    Coronary artery disease Paternal Uncle    Colon cancer Maternal Grandfather    Diabetes Neg Hx    Stomach cancer Neg Hx    Rectal cancer Neg Hx     Social History   Socioeconomic History   Marital status: Married    Spouse name: Not on file   Number of children: 2   Years of education: Not on file   Highest education level: Not on file  Occupational History   Occupation: plumbing, has his own co    Comment: [employer]  Tobacco Use   Smoking status: Former    Current  packs/day: 0.00    Types: Cigarettes    Quit date: 05/31/2019    Years since quitting: 4.5   Smokeless tobacco: Never   Tobacco comments:       Vaping Use   Vaping status: Never Used  Substance and Sexual Activity   Alcohol use: Yes    Alcohol/week: 0.0 standard drinks of alcohol    Comment: socially    Drug use: No   Sexual activity: Yes  Other Topics Concern   Not on file  Social History Narrative   2 children: 1987, 2008   Household: pt, wife, son   Social Drivers of Corporate Investment Banker Strain: Not on Ship [employer]ker Insecurity: Not on file  Transportation Needs: Not on file  Physical Activity: Not on file  Stress: Not on file  Social Connections: Not on file  Intimate Partner Violence: Not on file    Outpatient Medications Prior to Visit  Medication Sig Dispense Refill   aspirin EC 81 MG tablet Take 81 mg by mouth daily.     EPINEPHrine 0.3 mg/0.3 mL IJ SOAJ injection SMARTSIG:Injection IM As Directed     famotidine  (PEPCID ) 20 MG tablet Take 1 tablet (20 mg total) by mouth 2  (two) times daily. 180 tablet 3   rosuvastatin  (CRESTOR ) 10 MG tablet Take 1 tablet by mouth once daily 90 tablet 3   diltiazem  2 % GEL Using your index finger, apply a small amount of medication inside the rectum up to your first knuckle/joint twice daily x 4 weeks. (Patient not taking: Reported on 03/05/2023) 30 g 0   metroNIDAZOLE (METROGEL) 0.75 % gel SMARTSIG:Sparingly Topical Twice Daily     No facility-administered medications prior to visit.    Allergies  Allergen Reactions   Alpha-Gal    Vicodin [Hydrocodone-Acetaminophen ] Nausea And Vomiting    Pt states when takes whole pill has nausea and vomiting but has taken 1/2 tab without difficulty     Review of Systems  Musculoskeletal:  Positive for neck pain.       Objective:    Physical Exam HENT:     Head: Normocephalic.     Right Ear: Tympanic membrane and ear canal normal.     Left Ear: Tympanic membrane and ear canal normal.     Mouth/Throat:     Mouth: Mucous membranes are moist.     Pharynx: Oropharynx is clear.  Neck:     Comments: Mild swelling and tenderness of left sternocleidomastoid  Cardiovascular:     Rate and Rhythm: Normal rate and regular rhythm.     Heart sounds: Normal heart sounds, S1 normal and S2 normal.  Pulmonary:     Effort: Pulmonary effort is normal.  Lymphadenopathy:     Cervical: No cervical adenopathy.  Psychiatric:        Attention and Perception: Attention normal.        Mood and Affect: Mood and affect normal.        Speech: Speech normal.        Behavior: Behavior normal.        Thought Content: Thought content normal.        Cognition and Memory: Cognition normal.        Judgment: Judgment normal.      BP 137/89 (BP Location: Right Arm, Patient Position: Sitting, Cuff Size: Small)   Pulse (!) 51   Temp 97.7 F (36.5 C) (Oral)   Resp 16   Ht 6' 4 (1.93 m)  Wt 165 lb (74.8 kg)   SpO2 99%   BMI 20.08 kg/m  Wt Readings from Last 3 Encounters:  12/03/23 165 lb (74.8 kg)   04/05/23 167 lb (75.8 kg)  03/05/23 165 lb 8 oz (75.1 kg)       Assessment & Plan:   Problem List Items Addressed This Visit       Unprioritized   Neck swelling - Primary   New.  Area tender to palpation. No erythema, no discrete mass, mild swelling.  Could be musculoskeletal strain.  Recommended trial of meloxicam . He should follow up in 1 week. If no improvement or if increased swelling, will plan to order neck imaging for further evaluation.        I have discontinued Jerris L. Martindelcampo Les's diltiazem  and metroNIDAZOLE. I am also having him start on meloxicam . Additionally, I am having him maintain his aspirin EC, famotidine , EPINEPHrine, and rosuvastatin .  Meds ordered this encounter  Medications   meloxicam  (MOBIC ) 7.5 MG tablet    Sig: Take 1 tablet (7.5 mg total) by mouth daily.    Dispense:  14 tablet    Refill:  0    Supervising Provider:   DOMENICA BLACKBIRD A [[id number]]

## 2023-12-03 NOTE — Patient Instructions (Signed)
 VISIT SUMMARY:  Today, you came in because of a sore and swollen area on the right side of your neck that started after lifting a heavy piece of equipment at work. You mentioned that the swelling seemed to decrease overnight but then swelled again during the day. You also shared your concern due to your family history of cancer.  YOUR PLAN:  -NECK SWELLING: Neck swelling can be caused by various factors, including muscle strain, which seems to be the case here. You will be prescribed a stronger anti-inflammatory medication to take once daily for a week. We will re-evaluate your condition in one week, and if the swelling persists, we will order an ultrasound to investigate further.  INSTRUCTIONS:  Please take the prescribed anti-inflammatory medication once daily for a week. We will see you again in one week to check on your progress. If the swelling does not improve, we will perform an ultrasound to get a better look at the tissue.

## 2023-12-03 NOTE — Assessment & Plan Note (Signed)
 New.  Area tender to palpation. No erythema, no discrete mass, mild swelling.  Could be musculoskeletal strain.  Recommended trial of meloxicam . He should follow up in 1 week. If no improvement or if increased swelling, will plan to order neck imaging for further evaluation.

## 2023-12-10 ENCOUNTER — Telehealth: Payer: Self-pay | Admitting: Cardiovascular Disease

## 2023-12-10 ENCOUNTER — Ambulatory Visit: Payer: No Typology Code available for payment source | Admitting: Internal Medicine

## 2023-12-10 ENCOUNTER — Encounter: Payer: Self-pay | Admitting: Internal Medicine

## 2023-12-10 VITALS — BP 112/60 | HR 50 | Temp 98.0°F | Resp 16 | Ht 76.0 in | Wt 165.5 lb

## 2023-12-10 DIAGNOSIS — E7800 Pure hypercholesterolemia, unspecified: Secondary | ICD-10-CM | POA: Diagnosis not present

## 2023-12-10 DIAGNOSIS — Z8249 Family history of ischemic heart disease and other diseases of the circulatory system: Secondary | ICD-10-CM

## 2023-12-10 DIAGNOSIS — E785 Hyperlipidemia, unspecified: Secondary | ICD-10-CM

## 2023-12-10 DIAGNOSIS — Z87891 Personal history of nicotine dependence: Secondary | ICD-10-CM

## 2023-12-10 DIAGNOSIS — R221 Localized swelling, mass and lump, neck: Secondary | ICD-10-CM | POA: Diagnosis not present

## 2023-12-10 NOTE — Telephone Encounter (Signed)
Pt called in about his appt. He is scheduled for next available in June with Dr. Eden Emms. He asked if he can have some labs drawn now so he doesn't have to wait until June. Please advise.

## 2023-12-10 NOTE — Patient Instructions (Signed)
Stop by the first floor, arrange for a ultrasound of your neck.  We are referring you to ENT, if they do not contact you next week please let us know  Stop by the front desk, arrange for fasting blood work to be done at your convenience

## 2023-12-10 NOTE — Progress Notes (Signed)
 Subjective:    Patient ID: Stanley Petty, male    DOB: [birth year], 58 y.o.   MRN: [id number]  DOS:  12/10/2023 Type of visit - description: Follow-up  Symptoms started approximately 11/29/2023, left side of the neck pain and swelling. Seen here 12/03/2023, mild swelling and tenderness at the L sternocleidomastoid noted. No  obvious  infection or abscess, MSK strain?  Was Rx meloxicam.  Here for follow-up  Since that time, the area is perhaps a little decrease in size and is not painful.  No recent fever or chills. No recent URI, no injury. No weight loss, no nocturnal sweats.  Wt Readings from Last 3 Encounters:  12/10/23 165 lb 8 oz (75.1 kg)  12/03/23 165 lb (74.8 kg)  04/05/23 167 lb (75.8 kg)     Review of Systems See above   Past Medical History:  Diagnosis Date   Adenomatous colon polyp    Allergy to alpha-gal    Duodenitis    Gastritis    GERD (gastroesophageal reflux disease)    IBS (irritable bowel syndrome)    Dr Kinnie Scales   Internal hemorrhoids    Skin cancer 03/2018   L hand, ?BCC    Past Surgical History:  Procedure Laterality Date   CHOLECYSTECTOMY N/A 01/15/2017   Procedure: LAPAROSCOPIC CHOLECYSTECTOMY;  Surgeon: Axel Filler, MD;  Location: [facility];  Service: General;  Laterality: N/A;   COLONOSCOPY     CYSTOSCOPY     Dr Isabel Caprice   HEMORRHOID BANDING     [place]    MULTIPLE TOOTH EXTRACTIONS     SURGERY ON TESTICLE     at age 64    Current Outpatient Medications  Medication Instructions   aspirin EC 81 mg, Daily   EPINEPHrine 0.3 mg/0.3 mL IJ SOAJ injection SMARTSIG:Injection IM As Directed   famotidine (PEPCID) 20 mg, Oral, 2 times daily   meloxicam (MOBIC) 7.5 mg, Oral, Daily   rosuvastatin (CRESTOR) 10 mg, Oral, Daily       Objective:   Physical Exam Neck:      Comments: Firm gland at the left neck ~ 1.5-2 cm , not tender, nonfluctuant, although it is not attached to deeper structures, is not moving freely. BP 112/60   Pulse (!) 50    Temp 98 F (36.7 C) (Oral)   Resp 16   Ht 6\' 4"  (1.93 m)   Wt 165 lb 8 oz (75.1 kg)   SpO2 97%   BMI 20.15 kg/m  General:   Well developed, NAD, BMI noted. HEENT:  Normocephalic . Face symmetric, atraumatic Oral cavity: Membranes without obvious lesions.  Tonsils are small. Lymphatic system: No unusual lymph nodes and the axillary and groin areas. Neck: No thyromegaly, see graphic. Lungs:  CTA B Normal respiratory effort, no intercostal retractions, no accessory muscle use. Heart: RRR,  no murmur.  Lower extremities: no pretibial edema bilaterally  Skin: Not pale. Not jaundice Neurologic:  alert & oriented X3.  Speech normal, gait appropriate for age and unassisted Psych--  Cognition and judgment appear intact.  Cooperative with normal attention span and concentration.  Behavior appropriate. No anxious or depressed appearing.      Assessment     Assessment IBS  Hemorrhoids, h/o banding Skin cancer , BCC? L hand 2019 2012: Abdominal pain, [place] 2 liver lesions; HIDA: decreased EF  +FH CAD -ETT normal 10-2014 -Coronary calcium score 300 (04-2020), high -ETT 04/2020 neg Alpha gal syndrome   PLAN Left neck mass: Firm, left-sided neck mass,  does not seem to be a infection.  The patient is a former heavy smoker. Plan: [place], ENT referral. High cholesterol: Patient request to do FLP and lipoprotein a and send it to cardiology.  Will do.

## 2023-12-10 NOTE — Telephone Encounter (Signed)
Called patient back to see what lab work he was wanting to get. Patient would like to get Lipid panel. Dr. Eden Emms stated that was okay and to get a Lipoprotien A. Order placed, so patient can get at any [facility].

## 2023-12-11 NOTE — Assessment & Plan Note (Signed)
 Left neck mass: Firm, left-sided neck mass, does not seem to be a infection.  The patient is a former heavy smoker. Plan: [place], ENT referral. High cholesterol: Patient request to do FLP and lipoprotein a and send it to cardiology.  Will do.

## 2023-12-14 ENCOUNTER — Other Ambulatory Visit (INDEPENDENT_AMBULATORY_CARE_PROVIDER_SITE_OTHER): Payer: No Typology Code available for payment source

## 2023-12-14 ENCOUNTER — Ambulatory Visit (HOSPITAL_BASED_OUTPATIENT_CLINIC_OR_DEPARTMENT_OTHER)
Admission: RE | Admit: 2023-12-14 | Discharge: 2023-12-14 | Disposition: A | Discharge status: Home / Self Care | Payer: No Typology Code available for payment source | Source: Ambulatory Visit | Attending: Internal Medicine | Admitting: Internal Medicine

## 2023-12-14 DIAGNOSIS — Z87891 Personal history of nicotine dependence: Secondary | ICD-10-CM | POA: Diagnosis present

## 2023-12-14 DIAGNOSIS — R221 Localized swelling, mass and lump, neck: Secondary | ICD-10-CM | POA: Diagnosis present

## 2023-12-14 DIAGNOSIS — E7800 Pure hypercholesterolemia, unspecified: Secondary | ICD-10-CM

## 2023-12-14 LAB — LIPID PANEL
Cholesterol: 131 mg/dL (ref 0–200)
HDL: 47.5 mg/dL (ref >39.00)
LDL Cholesterol: 72 mg/dL (ref 0–99)
NonHDL: 83.27
Total CHOL/HDL Ratio: 3
Triglycerides: 58 mg/dL (ref 0.0–149.0)
VLDL: 11.6 mg/dL (ref 0.0–40.0)

## 2023-12-15 ENCOUNTER — Telehealth: Payer: Self-pay | Admitting: Internal Medicine

## 2023-12-15 NOTE — Telephone Encounter (Signed)
 Called the patient, apparently he called by accident. I made him aware that our team is working on getting the CT scheduled.

## 2023-12-15 NOTE — Telephone Encounter (Signed)
 Copied from CRM [id number]. Topic: Clinical - Request for Lab/Test Order >> Dec 15, 2023  8:04 AM Deaijah H wrote: Reason for CRM: Patient called in wanting Dr. Drue Novel nurse to give a call to speak about a CT scan. / please call [phone]

## 2023-12-15 NOTE — Addendum Note (Signed)
 Addended byConrad Craig Beach D on: 12/15/2023 09:12 AM   Modules accepted: Orders

## 2023-12-16 ENCOUNTER — Encounter: Payer: Self-pay | Admitting: Internal Medicine

## 2023-12-16 ENCOUNTER — Ambulatory Visit (HOSPITAL_BASED_OUTPATIENT_CLINIC_OR_DEPARTMENT_OTHER)
Admission: RE | Admit: 2023-12-16 | Discharge: 2023-12-16 | Disposition: A | Discharge status: Home / Self Care | Payer: No Typology Code available for payment source | Source: Ambulatory Visit | Attending: Internal Medicine | Admitting: Internal Medicine

## 2023-12-16 ENCOUNTER — Telehealth: Payer: Self-pay

## 2023-12-16 DIAGNOSIS — R221 Localized swelling, mass and lump, neck: Secondary | ICD-10-CM

## 2023-12-16 LAB — LIPOPROTEIN A (LPA): Lipoprotein (a): 12 nmol/L (ref <75)

## 2023-12-16 MED ORDER — IOHEXOL 300 MG/ML  SOLN
100.0000 mL | Freq: Once | INTRAMUSCULAR | Status: AC | PRN
  Administered 2023-12-16: 75 mL via INTRAVENOUS

## 2023-12-16 NOTE — Telephone Encounter (Signed)
 Spoke w/ Deforest Hoyles w/ Radiology- wanted to make PCP aware of results below.    IMPRESSION: 1. 2.9 x 1.9 cm thick-walled and centrally cystic/necrotic-appearing lesion within the left upper neck (at the level II station). This is most suspicious for a pathologic lymph node (such as from nodal metastatic disease). Consider direct tissue sampling. 2. Adjacent borderline enlarged left level II lymph node, also suspicious. 3. Streak/beam hardening artifact arising from dental restoration partially obscures the oral cavity and pharynx. Mild nonspecific asymmetric prominence of the lingual tonsils on the left. ENT referral and direct visualization recommended to exclude a mucosal/submucosal neoplasm at this site. A PET-CT should also be considered to help assess for a head/neck primary malignancy (and for other sites of nodal metastatic disease). 4. Minor paranasal sinus disease. 5. Emphysema (ICD10-J43.9).     Electronically Signed   By: Jackey Loge D.O.   On: 12/16/2023 12:56

## 2023-12-16 NOTE — Telephone Encounter (Signed)
 Patient notified by me verbally.

## 2023-12-23 ENCOUNTER — Ambulatory Visit (INDEPENDENT_AMBULATORY_CARE_PROVIDER_SITE_OTHER): Payer: No Typology Code available for payment source | Admitting: Otolaryngology

## 2023-12-23 ENCOUNTER — Encounter (INDEPENDENT_AMBULATORY_CARE_PROVIDER_SITE_OTHER): Payer: Self-pay | Admitting: Otolaryngology

## 2023-12-23 VITALS — BP 121/77 | HR 72 | Ht 76.0 in | Wt 165.0 lb

## 2023-12-23 DIAGNOSIS — Z87891 Personal history of nicotine dependence: Secondary | ICD-10-CM

## 2023-12-23 DIAGNOSIS — K219 Gastro-esophageal reflux disease without esophagitis: Secondary | ICD-10-CM

## 2023-12-23 DIAGNOSIS — R590 Localized enlarged lymph nodes: Secondary | ICD-10-CM

## 2023-12-23 DIAGNOSIS — D3705 Neoplasm of uncertain behavior of pharynx: Secondary | ICD-10-CM

## 2023-12-23 NOTE — Progress Notes (Signed)
 ENT CONSULT:  Reason for Consult: neck mass/lymphadenopathy   HPI: Discussed the use of AI scribe software for clinical note transcription with the patient, who gave verbal consent to proceed.  History of Present Illness   Stanley Petty "Stanley Petty" is a 58 year old male who presents with a left neck mass.  He noticed a lump in his neck approximately three weeks ago. The mass is slightly sore, similar to a bruise, and fluctuates in size, becoming more prominent during the day, especially with physical activity, and reducing in size at other times. He has undergone an ultrasound and a CT scan as part of the workup, which showed left neck nodes and asymmetry along the base of the tongue.  No associated symptoms such as cold symptoms, cough, fever, or changes in voice with his sx. No pain when swallowing or hemoptysis. He reports a sensation of thicker saliva since having a dental implant around the time of the COVID pandemic.  His past medical history includes a small hiatal hernia, for which he takes medication like Pepcid only as needed. He had it diagnosed during EGD 03/2023. He is on cholesterol medication. He has a history of smoking but quit five years ago. Denies heavy alcohol use.   Family history is significant for esophageal cancer in his father and colon cancer in his grandfather. No personal hx of cancer.     Records Reviewed:  Office Visit with Dr Drue Novel 12/10/23 [facility] Left neck mass: Firm, left-sided neck mass, does not seem to be a infection.  The patient is a former heavy smoker. Plan: [place], ENT referral.   Past Medical History:  Diagnosis Date   Adenomatous colon polyp    Allergy to alpha-gal    Duodenitis    Gastritis    GERD (gastroesophageal reflux disease)    IBS (irritable bowel syndrome)    Dr Kinnie Scales   Internal hemorrhoids    Skin cancer 03/2018   L hand, ?BCC    Past Surgical History:  Procedure Laterality Date   CHOLECYSTECTOMY N/A 01/15/2017   Procedure: LAPAROSCOPIC  CHOLECYSTECTOMY;  Surgeon: Axel Filler, MD;  Location: [facility];  Service: General;  Laterality: N/A;   COLONOSCOPY     CYSTOSCOPY     Dr Isabel Caprice   HEMORRHOID BANDING     [place]    MULTIPLE TOOTH EXTRACTIONS     SURGERY ON TESTICLE     at age 73    Family History  Problem Relation Age of Onset   Colon polyps Mother        also gallstones   Heart disease Father 59       MI, stent age 54   Prostate cancer Father        dx ~ 47   Esophageal cancer Father 48   Hyperlipidemia Sister    Coronary artery disease Paternal Uncle    Colon cancer Maternal Grandfather    Diabetes Neg Hx    Stomach cancer Neg Hx    Rectal cancer Neg Hx     Social History:  reports that he quit smoking about 4 years ago. His smoking use included cigarettes. He has never used smokeless tobacco. He reports current alcohol use. He reports that he does not use drugs.  Allergies:  Allergies  Allergen Reactions   Alpha-Gal    Vicodin [Hydrocodone-Acetaminophen] Nausea And Vomiting    Pt states when takes whole pill has nausea and vomiting but has taken 1/2 tab without difficulty     Medications:  I have reviewed the patient's current medications.  The PMH, PSH, Medications, Allergies, and SH were reviewed and updated.  ROS: Constitutional: Negative for fever, weight loss and weight gain. Cardiovascular: Negative for chest pain and dyspnea on exertion. Respiratory: Is not experiencing shortness of breath at rest. Gastrointestinal: Negative for nausea and vomiting. Neurological: Negative for headaches. Psychiatric: The patient is not nervous/anxious  Blood pressure 121/77, pulse 72, height 6\' 4"  (1.93 m), weight 165 lb (74.8 kg), SpO2 99%. Body mass index is 20.08 kg/m.  PHYSICAL EXAM:  Exam: General: Well-developed, well-nourished Communication and Voice: Clear pitch and clarity Respiratory Respiratory effort: Equal inspiration and expiration without stridor Cardiovascular Peripheral Vascular:  Warm extremities with equal color/perfusion Eyes: No nystagmus with equal extraocular motion bilaterally Neuro/Psych/Balance: Patient oriented to person, place, and time; Appropriate mood and affect; Gait is intact with no imbalance; Cranial nerves I-XII are intact Head and Face Inspection: Normocephalic and atraumatic without mass or lesion Palpation: Facial skeleton intact without bony stepoffs Salivary Glands: No mass or tenderness Facial Strength: Facial motility symmetric and full bilaterally ENT Pinna: External ear intact and fully developed External canal: Canal is patent with intact skin Tympanic Membrane: Clear and mobile External Nose: No scar or anatomic deformity Internal Nose: Septum is deviated to the left. No polyp, or purulence. Mucosal edema and erythema present.  Bilateral inferior turbinate hypertrophy.  Lips, Teeth, and gums: Mucosa and teeth intact and viable TMJ: No pain to palpation with full mobility Oral cavity/oropharynx: No erythema or exudate, no lesions present small exophytic lesion at the left base of the tongue seen only on the flexible scope exam, 2+ tonsil on the left and 1+ on the right,  slightly larger on the left side, otherwise appear as normal tonsillar tissue Nasopharynx: No mass or lesion with intact mucosa Hypopharynx: Intact mucosa without pooling of secretions Larynx Glottic: Full true vocal cord mobility without lesion or mass Supraglottic: Normal appearing epiglottis and AE folds Interarytenoid Space: Moderate pachydermia&edema Subglottic Space: Patent without lesion or edema Neck Neck and Trachea: Midline trachea without mass or lesion Thyroid: No mass or nodularity Lymphatics: (+) lymphadenopathy left level II ~2 cm no overlying skin changes  Procedure: Preoperative diagnosis: left neck mass and base of the tongue fullness on the left on CT  Postoperative diagnosis:   Same + left base of the tongue mass + GERD LPR  Procedure:  Flexible fiberoptic laryngoscopy  Surgeon: Ashok Croon, MD  Anesthesia: Topical lidocaine and Afrin Complications: None Condition is stable throughout exam  Indications and consent:  The patient presents to the clinic with above symptoms. Indirect laryngoscopy view was incomplete. Thus it was recommended that they undergo a flexible fiberoptic laryngoscopy. All of the risks, benefits, and potential complications were reviewed with the patient preoperatively and verbal informed consent was obtained.  Procedure: The patient was seated upright in the clinic. Topical lidocaine and Afrin were applied to the nasal cavity. After adequate anesthesia had occurred, I then proceeded to pass the flexible telescope into the nasal cavity. The nasal cavity was patent without rhinorrhea or polyp. The nasopharynx was also patent without mass or lesion. The base of tongue was visualized and was normal. There were no signs of pooling of secretions in the piriform sinuses. The true vocal folds were mobile bilaterally. There were no signs of glottic or supraglottic mucosal lesion or mass. There was moderate interarytenoid pachydermia and post cricoid edema. The telescope was then slowly withdrawn and the patient tolerated the procedure throughout.  Studies Reviewed: CT neck w/con 12/16/23 FINDINGS: Pharynx and larynx: Prominent streak/beam hardening artifact arising from dental restoration partially obscures the oral cavity and pharynx. Mild nonspecific asymmetric prominence of the lingual tonsils on the left (for instance as seen on series 304, image 49) (series 602, image 71). No appreciable mass elsewhere within the visible oral cavity, pharynx or larynx.   Salivary glands: No inflammation, mass, or stone.   Thyroid: Unremarkable.   Lymph nodes: 2.9 x 1.9 cm thick-walled and centrally cystic/necrotic-appearing lesion within the left upper neck at the level II station (for instance as seen on  series 304, image 36) (series 602, image 91). This appears separate from the left parotid gland and is most suspicious for an abnormal lymph node (such as from nodal metastatic disease). Immediately anterior to this, there is a borderline enlarged level II lymph node which measures 11 mm in short axis, also suspicious (series 304, image 44) (series 602, image 90).   Vascular: The major vascular structures of the neck are patent.   Limited intracranial: No evidence of an acute intracranial abnormality within the field of view.   Visualized orbits: No orbital mass or acute orbital finding.   Mastoids and visualized paranasal sinuses: Portions of the left frontal sinus are excluded from the field of view superiorly. Minimal mucosal thickening scattered within bilateral ethmoid air cells. Trace mucosal thickening within the bilateral sphenoid sinuses. No significant mastoid effusion.   Skeleton: Cervical spondylosis. No acute fracture or aggressive osseous lesion.   Upper chest: No consolidation within the imaged lung apices. Emphysema.   Impressions #1, #2 and #3 will be called to the ordering clinician or representative by the Radiologist Assistant, and communication documented in the PACS or [employer].   IMPRESSION: 1. 2.9 x 1.9 cm thick-walled and centrally cystic/necrotic-appearing lesion within the left upper neck (at the level II station). This is most suspicious for a pathologic lymph node (such as from nodal metastatic disease). Consider direct tissue sampling. 2. Adjacent borderline enlarged left level II lymph node, also suspicious. 3. Streak/beam hardening artifact arising from dental restoration partially obscures the oral cavity and pharynx. Mild nonspecific asymmetric prominence of the lingual tonsils on the left. ENT referral and direct visualization recommended to exclude a mucosal/submucosal neoplasm at this site. A PET-CT should also be considered to  help assess for a head/neck primary malignancy (and for other sites of nodal metastatic disease). 4. Minor paranasal sinus disease. 5. Emphysema (ICD10-J43.9).  U/S soft tissue 12/14/23 IMPRESSION: Positive for a solitary 2.2 cm hypoechoic mass at the palpable area of concern which appears to be solid. Top differential considerations are a primary salivary gland neoplasm (such as arising from the parotid space) and a malignant lymph node.      Assessment/Plan: Encounter Diagnoses  Name Primary?   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 No associated cold symptoms, cough, fever, or dental issues. Former smoker, quit five years ago. Physical exam and imaging (ultrasound and CT) reveal a suspicious lesion at the base of the tongue and significantly enlarged left cervical lymph nodes left level II. Differential diagnosis includes head and neck cancer, given age and smoking history. Discussed need for biopsy under general anesthesia to confirm diagnosis. If cancer is  confirmed, treatment options include surgery, radiation, or chemoradiation, depending on stage and type. Early diagnosis generally leads to better outcomes. Patient and wife, a two-time breast cancer survivor, are familiar with cancer treatments. - Perform biopsy of the lesion under general anesthesia - Order needle biopsy of the lymph node with ultrasound guidance if oropharyngeal bx is non-diagnostic  - Schedule PET CT if biopsy confirms cancer - Discuss case in tumor board if cancer is confirmed - Coordinate with surgical oncology, medical oncology and radiation oncology for treatment planning if cancer is confirmed  GERD LPR and hx of Hiatal Hernia He reports thick saliva. Flexible scope exam with findings c/w  GERD LPR. Small hiatal hernia noted on previous endoscopy/EGD. No current symptoms. Takes Pepcid as needed. - Continue Pepcid as needed for reflux symptoms   General Health Maintenance Up-to-date with endoscopy and colonoscopy screenings due to family history of esophageal and colon cancer. - Maintain regular follow-up with primary care for ongoing health maintenance  Follow-up - Schedule biopsy procedure under general anesthesia     Thank you for allowing me to participate in the care of this patient. Please do not hesitate to contact me with any questions or concerns.   Ashok Croon, MD Otolaryngology [facility] Phone: [phone] Fax: [phone]    12/23/2023, 2:28 PM

## 2023-12-27 ENCOUNTER — Telehealth: Payer: Self-pay | Admitting: Otolaryngology

## 2023-12-27 ENCOUNTER — Telehealth (INDEPENDENT_AMBULATORY_CARE_PROVIDER_SITE_OTHER): Payer: Self-pay | Admitting: Otolaryngology

## 2023-12-27 NOTE — Telephone Encounter (Signed)
 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 If it will be 2 week which is possible per clinical staff, he wants to r/s his procedure, called ENT sx sch to r/s his procedure

## 2023-12-27 NOTE — Telephone Encounter (Signed)
 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 Provided patient with the next available surgery date; patient advised - he will call back to schedule.

## 2023-12-29 ENCOUNTER — Institutional Professional Consult (permissible substitution) (INDEPENDENT_AMBULATORY_CARE_PROVIDER_SITE_OTHER): Payer: No Typology Code available for payment source | Admitting: Otolaryngology

## 2023-12-29 ENCOUNTER — Encounter (HOSPITAL_COMMUNITY): Admission: RE | Payer: Self-pay | Source: Home / Self Care

## 2023-12-29 ENCOUNTER — Ambulatory Visit (HOSPITAL_COMMUNITY): Admission: RE | Admit: 2023-12-29 | Payer: No Typology Code available for payment source | Source: Home / Self Care

## 2023-12-29 SURGERY — LARYNGOSCOPY, DIRECT
Anesthesia: Choice

## 2024-01-04 DIAGNOSIS — C770 Secondary and unspecified malignant neoplasm of lymph nodes of head, face and neck: Secondary | ICD-10-CM | POA: Insufficient documentation

## 2024-01-05 ENCOUNTER — Telehealth (INDEPENDENT_AMBULATORY_CARE_PROVIDER_SITE_OTHER): Payer: Self-pay | Admitting: Otolaryngology

## 2024-01-05 NOTE — Telephone Encounter (Signed)
 Called patient to get surgery rescheduled. Patient refused - going to seek treatment elsewhere.

## 2024-01-14 ENCOUNTER — Telehealth: Payer: Self-pay | Admitting: Internal Medicine

## 2024-01-14 MED ORDER — DILTIAZEM GEL 2 %: 1.0000 | Freq: Two times a day (BID) | CUTANEOUS | 0 refills | Status: DC

## 2024-01-14 NOTE — Telephone Encounter (Signed)
 Patient calls with complaints of rectal pain following bowel movements x several weeks. States it "feels like fire" after BM. He denies any rectal bleeding but has noticed that his stools have been much looser than normal. No abdominal pain, no purulent drainage. 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  Patient is advised that he should purchase lidocaine 5% gel OTC and use as needed for rectal pain. He should also purchase Benefiber over the counter and start with 1 teaspoon daily, working eventually to 1 tablespoon daily to help bulk the stool. Advised patient to use sitz baths several times per day and pat himself clean rather than wipe clean.   Patient is scheduled for appointment with Boone Master, PA-C on 01/25/24 at 1:30 pm.   Please advise on any additional recommendations in the meantime before 4/1 appointment.

## 2024-01-14 NOTE — Telephone Encounter (Signed)
 I have spoken to patient to advise that we will send rx for diltiazem to [facility] for him to pick up and use twice daily x 4 weeks. Advised that we will do a rectal exam to confirm at his upcoming visit.   Refilled the following rx:  dilTIAZem HCl 2 % Using your index finger, apply a small amount of medication inside the rectum up to your first knuckle/joint twice daily x 4 weeks.

## 2024-01-14 NOTE — Telephone Encounter (Signed)
 Patient called and stated that he was having trouble with anal fissure and was wondering if Dr. Rhea Belton will give him a call regarding prescribing him medication for the anal fissure. Patient is requesting a call back. Please advise.

## 2024-01-19 ENCOUNTER — Other Ambulatory Visit (HOSPITAL_COMMUNITY): Payer: Self-pay | Admitting: Otolaryngology

## 2024-01-19 DIAGNOSIS — C770 Secondary and unspecified malignant neoplasm of lymph nodes of head, face and neck: Secondary | ICD-10-CM

## 2024-01-19 NOTE — Progress Notes (Signed)
 Roanna Banning, MD  Stanley Petty PROCEDURE / BIOPSY REVIEW Date: 01/19/24  Requested Biopsy site: L neck Reason for request: Mass Imaging review: Best seen on US neck and CT neck  Decision: Approved Imaging modality to perform: Ultrasound Schedule with: No sedation / Local anesthetic Schedule for: Any VIR  Additional comments: @VIR : centrally hypodense on CT, Q necrotic core? @Schedulers . [place] L neck lymph node mass BX. Local  Please contact me with questions, concerns, or if issue pertaining to this request arise.  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

## 2024-01-25 ENCOUNTER — Ambulatory Visit: Admitting: Gastroenterology

## 2024-02-10 ENCOUNTER — Other Ambulatory Visit: Payer: Self-pay | Admitting: Interventional Radiology

## 2024-02-10 NOTE — Progress Notes (Signed)
 Chief Complaint: Left upper neck mass  Referring Provider(s): Virgina Grills  Supervising Physician: Marland Silvas  Patient Status: [facility] - Out-pt  History of Present Illness: Stanley Petty is a 58 y.o. male who noticed a lump in his neck in January    CT scan done 12/16/23 showed= 2.9 x 1.9 cm thick-walled and centrally cystic/necrotic-appearing lesion within the left upper neck (at the level II station). This is most suspicious for a pathologic lymph node (such as from nodal metastatic disease).   He is here today for image guided biopsy.  He states he had aspiration done at the ENT office but results were non diagnostic.  Patient is Full Code  Past Medical History:  Diagnosis Date   Adenomatous colon polyp    Allergy to alpha-gal    Duodenitis    Gastritis    GERD (gastroesophageal reflux disease)    IBS (irritable bowel syndrome)    Dr Andriette Keeling   Internal hemorrhoids    Skin cancer 03/2018   L hand, ?BCC    Past Surgical History:  Procedure Laterality Date   CHOLECYSTECTOMY N/A 01/15/2017   Procedure: LAPAROSCOPIC CHOLECYSTECTOMY;  Surgeon: Shela Derby, MD;  Location: [facility]S;  Service: General;  Laterality: N/A;   COLONOSCOPY     CYSTOSCOPY     Dr Bosie Bye   HEMORRHOID BANDING     [place]    MULTIPLE TOOTH EXTRACTIONS     SURGERY ON TESTICLE     at age 76    Allergies: Alpha-gal and Vicodin [hydrocodone-acetaminophen ]  Medications: Prior to Admission medications   Medication Sig Start Date End Date Taking? Authorizing Provider  aspirin EC 81 MG tablet Take 81 mg by mouth daily.    [provider]  diltiazem  2 % GEL Apply 1 Application topically 2 (two) times daily. Using your index finger, apply a pea sized amount of medication inside the rectum up to your first knuckle/joint twice daily x 4 weeks. 01/14/24   McMichael, Elba Gre[place]ouse, PA-C  EPINEPHrine 0.3 mg/0.3 mL IJ SOAJ injection Inject 0.3 mg into the muscle as needed for anaphylaxis  (ALPHA-GAL). 02/02/23   [provider]  famotidine  (PEPCID ) 20 MG tablet Take 1 tablet (20 mg total) by mouth 2 (two) times daily. Patient taking differently: Take 20 mg by mouth daily as needed for heartburn or indigestion. 09/11/22   Ezell Hollow, MD  meloxicam  (MOBIC ) 7.5 MG tablet Take 1 tablet (7.5 mg total) by mouth daily. Patient not taking: Reported on 12/23/2023 12/03/23   O'Sullivan, Melissa, NP  rosuvastatin  (CRESTOR ) 10 MG tablet Take 1 tablet by mouth once daily 04/16/23   Loyde Rule, MD     Family History  Problem Relation Age of Onset   Colon polyps Mother        also gallstones   Heart disease Father 67       MI, stent age 94   Prostate cancer Father        dx ~ 22   Esophageal cancer Father 89   Hyperlipidemia Sister    Coronary artery disease Paternal Uncle    Colon cancer Maternal Grandfather    Diabetes Neg Hx    Stomach cancer Neg Hx    Rectal cancer Neg Hx     Social History   Socioeconomic History   Marital status: Married    Spouse name: Not on file   Number of children: 2   Years of education: Not on file   Highest  education level: Not on file  Occupational History   Occupation: plumbing, has his own co    Comment: [employer]  Tobacco Use   Smoking status: Former    Current packs/day: 0.00    Types: Cigarettes    Quit date: 05/31/2019    Years since quitting: 4.7   Smokeless tobacco: Never   Tobacco comments:       Vaping Use   Vaping status: Never Used  Substance and Sexual Activity   Alcohol use: Yes    Alcohol/week: 0.0 standard drinks of alcohol    Comment: socially    Drug use: No   Sexual activity: Yes  Other Topics Concern   Not on file  Social History Narrative   2 children: 1987, 2008   Household: pt, wife, son   Social Drivers of Corporate investment banker Strain: Not on [employer] Insecurity: Not on file  Transportation Needs: Not on file  Physical Activity: Not on file  Stress: Not on file  Social  Connections: Not on file    Vital Signs: There were no vitals taken for this visit.  Advance Care Plan: The advanced care place/surrogate decision maker was discussed at the time of visit and the patient did not wish to discuss or was not able to name a surrogate decision maker or provide an advance care plan.  Physical Exam Constitutional:      Appearance: Normal appearance.  HENT:     Head: Normocephalic and atraumatic.  Eyes:     Extraocular Movements: Extraocular movements intact.  Cardiovascular:     Rate and Rhythm: Normal rate and regular rhythm.  Pulmonary:     Effort: Pulmonary effort is normal. No respiratory distress.     Breath sounds: Normal breath sounds.  Abdominal:     General: There is no distension.     Palpations: Abdomen is soft.     Tenderness: There is no abdominal tenderness.  Musculoskeletal:        General: Normal range of motion.     Cervical back: Normal range of motion.  Skin:    General: Skin is warm and dry.  Neurological:     General: No focal deficit present.     Mental Status: He is alert and oriented to person, place, and time.  Psychiatric:        Mood and Affect: Mood normal.        Behavior: Behavior normal.        Thought Content: Thought content normal.        Judgment: Judgment normal.     Imaging: CLINICAL DATA:  Provided history: Neck mass. Neck mass, non-pulsatile. Additional history provided by the scanning technologist: Left-sided neck mass first noticed two weeks ago (marked with BB).   EXAM: CT NECK WITH CONTRAST   TECHNIQUE: Multidetector CT imaging of the neck was performed using the standard protocol following the bolus administration of intravenous contrast.   RADIATION DOSE REDUCTION: This exam was performed according to the departmental dose-optimization program which includes automated exposure control, adjustment of the mA and/or kV according to patient size and/or use of iterative reconstruction  technique.   CONTRAST:  75mL OMNIPAQUE  IOHEXOL  300 MG/ML  SOLN   COMPARISON:  Ultrasound of the neck soft tissues 12/14/2023.   FINDINGS: Pharynx and larynx: Prominent streak/beam hardening artifact arising from dental restoration partially obscures the oral cavity and pharynx. Mild nonspecific asymmetric prominence of the lingual tonsils on the left (for instance as seen on  series 304, image 49) (series 602, image 71). No appreciable mass elsewhere within the visible oral cavity, pharynx or larynx.   Salivary glands: No inflammation, mass, or stone.   Thyroid : Unremarkable.   Lymph nodes: 2.9 x 1.9 cm thick-walled and centrally cystic/necrotic-appearing lesion within the left upper neck at the level II station (for instance as seen on series 304, image 36) (series 602, image 91). This appears separate from the left parotid gland and is most suspicious for an abnormal lymph node (such as from nodal metastatic disease). Immediately anterior to this, there is a borderline enlarged level II lymph node which measures 11 mm in short axis, also suspicious (series 304, image 44) (series 602, image 90).   Vascular: The major vascular structures of the neck are patent.   Limited intracranial: No evidence of an acute intracranial abnormality within the field of view.   Visualized orbits: No orbital mass or acute orbital finding.   Mastoids and visualized paranasal sinuses: Portions of the left frontal sinus are excluded from the field of view superiorly. Minimal mucosal thickening scattered within bilateral ethmoid air cells. Trace mucosal thickening within the bilateral sphenoid sinuses. No significant mastoid effusion.   Skeleton: Cervical spondylosis. No acute fracture or aggressive osseous lesion.   Upper chest: No consolidation within the imaged lung apices. Emphysema.   Impressions #1, #2 and #3 will be called to the ordering clinician or representative by the Radiologist  Assistant, and communication documented in the PACS or [employer].   IMPRESSION: 1. 2.9 x 1.9 cm thick-walled and centrally cystic/necrotic-appearing lesion within the left upper neck (at the level II station). This is most suspicious for a pathologic lymph node (such as from nodal metastatic disease). Consider direct tissue sampling. 2. Adjacent borderline enlarged left level II lymph node, also suspicious. 3. Streak/beam hardening artifact arising from dental restoration partially obscures the oral cavity and pharynx. Mild nonspecific asymmetric prominence of the lingual tonsils on the left. ENT referral and direct visualization recommended to exclude a mucosal/submucosal neoplasm at this site. A PET-CT should also be considered to help assess for a head/neck primary malignancy (and for other sites of nodal metastatic disease). 4. Minor paranasal sinus disease. 5. Emphysema (ICD10-J43.9).     Electronically Signed   By: Bascom Lily D.O.   On: 12/16/2023 12:56    Labs:  CBC: Recent Labs    03/05/23 0833  WBC 7.0  HGB 15.9  HCT 46.8  PLT 221.0    COAGS: No results for input(s): "INR", "APTT" in the last 8760 hours.  BMP: Recent Labs    03/05/23 0833  NA 138  K 4.3  CL 105  CO2 27  GLUCOSE 84  BUN 15  CALCIUM  9.0  CREATININE 1.04    LIVER FUNCTION TESTS: No results for input(s): "BILITOT", "AST", "ALT", "ALKPHOS", "PROT", "ALBUMIN" in the last 8760 hours.  TUMOR MARKERS: No results for input(s): "AFPTM", "CEA", "CA199", "CHROMGRNA" in the last 8760 hours.  Assessment and Plan:  2.9 x 1.9 cm thick-walled and centrally cystic/necrotic-appearing lesion within the left upper neck.  Will proceed with image guided biopsy today by Dr. Marlena Sima.  Risks and benefits of left neck mass biopsy was discussed with the patient and/or patient's family including, but not limited to bleeding, infection, damage to adjacent structures or low yield requiring  additional tests.  All of the questions were answered and there is agreement to proceed.  Consent signed and in chart.   Electronically Signed: Connor Deiters, PA-C  02/11/2024, 1:13 PM      I spent a total of  15 Minutes   in face to face in clinical consultation, greater than 50% of which was counseling/coordinating care for neck mass biopsy

## 2024-02-11 ENCOUNTER — Ambulatory Visit (HOSPITAL_COMMUNITY)
Admission: RE | Admit: 2024-02-11 | Discharge: 2024-02-11 | Disposition: A | Discharge status: Home / Self Care | Source: Ambulatory Visit | Attending: Otolaryngology | Admitting: Otolaryngology

## 2024-02-11 DIAGNOSIS — C770 Secondary and unspecified malignant neoplasm of lymph nodes of head, face and neck: Secondary | ICD-10-CM | POA: Insufficient documentation

## 2024-02-11 DIAGNOSIS — R590 Localized enlarged lymph nodes: Secondary | ICD-10-CM | POA: Insufficient documentation

## 2024-02-11 DIAGNOSIS — J439 Emphysema, unspecified: Secondary | ICD-10-CM | POA: Diagnosis not present

## 2024-02-11 MED ORDER — LIDOCAINE HCL (PF) 1 % IJ SOLN
5.0000 mL | Freq: Once | INTRAMUSCULAR | Status: AC
  Administered 2024-02-11: 5 mL

## 2024-02-11 NOTE — Procedures (Signed)
  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 Postprocedure diagnosis: same EBL:    minimal Complications:   none immediate  See full dictation in [employer].  Nicky Barrack MD Main # [phone] Pager  [phone] Mobile [phone]

## 2024-02-15 LAB — SURGICAL PATHOLOGY

## 2024-03-01 ENCOUNTER — Ambulatory Visit: Admitting: Gastroenterology

## 2024-03-02 ENCOUNTER — Other Ambulatory Visit: Payer: Self-pay | Admitting: Otolaryngology

## 2024-03-06 ENCOUNTER — Other Ambulatory Visit: Payer: Self-pay | Admitting: Otolaryngology

## 2024-03-06 ENCOUNTER — Ambulatory Visit (INDEPENDENT_AMBULATORY_CARE_PROVIDER_SITE_OTHER): Payer: Commercial Managed Care - HMO | Admitting: Internal Medicine

## 2024-03-06 ENCOUNTER — Encounter: Payer: Self-pay | Admitting: Internal Medicine

## 2024-03-06 VITALS — BP 122/68 | HR 58 | Temp 98.2°F | Resp 16 | Ht 76.0 in | Wt 172.2 lb

## 2024-03-06 DIAGNOSIS — T781XXA Other adverse food reactions, not elsewhere classified, initial encounter: Secondary | ICD-10-CM

## 2024-03-06 DIAGNOSIS — Z0000 Encounter for general adult medical examination without abnormal findings: Secondary | ICD-10-CM | POA: Diagnosis not present

## 2024-03-06 DIAGNOSIS — E7800 Pure hypercholesterolemia, unspecified: Secondary | ICD-10-CM | POA: Diagnosis not present

## 2024-03-06 LAB — COMPREHENSIVE METABOLIC PANEL WITH GFR
ALT: 23 U/L (ref 0–53)
AST: 21 U/L (ref 0–37)
Albumin: 4.1 g/dL (ref 3.5–5.2)
Alkaline Phosphatase: 76 U/L (ref 39–117)
BUN: 12 mg/dL (ref 6–23)
CO2: 27 meq/L (ref 19–32)
Calcium: 9.2 mg/dL (ref 8.4–10.5)
Chloride: 107 meq/L (ref 96–112)
Creatinine, Ser: 1.03 mg/dL (ref 0.40–1.50)
GFR: 79.07 mL/min (ref >60.00)
Glucose, Bld: 93 mg/dL (ref 70–99)
Potassium: 5 meq/L (ref 3.5–5.1)
Sodium: 141 meq/L (ref 135–145)
Total Bilirubin: 0.6 mg/dL (ref 0.2–1.2)
Total Protein: 6.5 g/dL (ref 6.0–8.3)

## 2024-03-06 LAB — CBC WITH DIFFERENTIAL/PLATELET
Basophils Absolute: 0.1 10*3/uL (ref 0.0–0.1)
Basophils Relative: 1.4 % (ref 0.0–3.0)
Eosinophils Absolute: 0.3 10*3/uL (ref 0.0–0.7)
Eosinophils Relative: 3.7 % (ref 0.0–5.0)
HCT: 48.8 % (ref 39.0–52.0)
Hemoglobin: 16.1 g/dL (ref 13.0–17.0)
Lymphocytes Relative: 29.5 % (ref 12.0–46.0)
Lymphs Abs: 2.4 10*3/uL (ref 0.7–4.0)
MCHC: 33 g/dL (ref 30.0–36.0)
MCV: 96.9 fl (ref 78.0–100.0)
Monocytes Absolute: 0.7 10*3/uL (ref 0.1–1.0)
Monocytes Relative: 9.2 % (ref 3.0–12.0)
Neutro Abs: 4.5 10*3/uL (ref 1.4–7.7)
Neutrophils Relative %: 56.2 % (ref 43.0–77.0)
Platelets: 240 10*3/uL (ref 150.0–400.0)
RBC: 5.03 Mil/uL (ref 4.22–5.81)
RDW: 14 % (ref 11.5–15.5)
WBC: 8 10*3/uL (ref 4.0–10.5)

## 2024-03-06 LAB — PSA: PSA: 0.89 ng/mL (ref 0.10–4.00)

## 2024-03-06 LAB — TSH: TSH: 1.23 u[IU]/mL (ref 0.35–5.50)

## 2024-03-06 NOTE — Progress Notes (Signed)
 Subjective:    Patient ID: Stanley Petty, male    DOB: 15-Feb-1966, 58 y.o.   MRN: [id number]  DOS:  03/06/2024 Type of visit - description: CPX  Here for CPX Chronic medical problems addressed.  Review of Systems   A 14 point review of systems is negative    Past Medical History:  Diagnosis Date   Adenomatous colon polyp    Allergy to alpha-gal    Duodenitis    Gastritis    GERD (gastroesophageal reflux disease)    IBS (irritable bowel syndrome)    Dr Andriette Keeling   Internal hemorrhoids    Skin cancer 03/2018   L hand, ?BCC    Past Surgical History:  Procedure Laterality Date   CHOLECYSTECTOMY N/A 01/15/2017   Procedure: LAPAROSCOPIC CHOLECYSTECTOMY;  Surgeon: Shela Derby, MD;  Location: [facility];  Service: General;  Laterality: N/A;   COLONOSCOPY     CYSTOSCOPY     Dr Bosie Bye   HEMORRHOID BANDING     [place]    MULTIPLE TOOTH EXTRACTIONS     SURGERY ON TESTICLE     at age 45   Social History   Socioeconomic History   Marital status: Married    Spouse name: Not on file   Number of children: 2   Years of education: Not on file   Highest education level: Not on file  Occupational History   Occupation: plumbing, has his own co    Comment: [employer]  Tobacco Use   Smoking status: Former    Current packs/day: 0.00    Types: Cigarettes    Quit date: 05/31/2019    Years since quitting: 4.7   Smokeless tobacco: Never   Tobacco comments:     Quit tobacco ~ 2020    1 ppd  Vaping Use   Vaping status: Never Used  Substance and Sexual Activity   Alcohol use: Yes    Alcohol/week: 0.0 standard drinks of alcohol    Comment: socially    Drug use: No   Sexual activity: Yes  Other Topics Concern   Not on file  Social History Narrative   2 children: 1987, 2008   Household: pt, wife, son   Social Drivers of Corporate investment banker Strain: Not on [employer] Insecurity: Not on file  Transportation Needs: Not on file  Physical Activity: Not on file  Stress:  Not on file  Social Connections: Not on file  Intimate Partner Violence: Not on file     Current Outpatient Medications  Medication Instructions   aspirin EC 81 mg, Daily   diltiazem  2 % GEL 1 Application, Topical, 2 times daily, Using your index finger, apply a pea sized amount of medication inside the rectum up to your first knuckle/joint twice daily x 4 weeks.   EPINEPHrine (EPI-PEN) 0.3 mg, As needed   famotidine  (PEPCID ) 20 mg, Oral, 2 times daily   meloxicam  (MOBIC ) 7.5 mg, Oral, Daily   rosuvastatin  (CRESTOR ) 10 mg, Oral, Daily       Objective:   Physical Exam BP 122/68   Pulse (!) 58   Temp 98.2 F (36.8 C) (Oral)   Resp 16   Ht 6\' 4"  (1.93 m)   Wt 172 lb 4 oz (78.1 kg)   SpO2 99%   BMI 20.97 kg/m  General: Well developed, NAD, BMI noted Neck: No  thyromegaly  HEENT:  Normocephalic . Face symmetric, atraumatic Lungs:  CTA B Normal respiratory effort, no intercostal retractions, no  accessory muscle use. Heart: RRR,  no murmur.  Abdomen:  Not distended, soft, non-tender. No rebound or rigidity.   Lower extremities: no pretibial edema bilaterally  Skin: Exposed areas without rash. Not pale. Not jaundice Neurologic:  alert & oriented X3.  Speech normal, gait appropriate for age and unassisted Strength symmetric and appropriate for age.  Psych: Cognition and judgment appear intact.  Cooperative with normal attention span and concentration.  Behavior appropriate. No anxious or depressed appearing.     Assessment     Assessment High cholesterol IBS  Hemorrhoids, h/o banding Skin cancer , BCC? L hand 2019 2012: Abdominal pain, US  2 liver lesions; HIDA: decreased EF  +FH CAD -ETT normal 10-2014 -Coronary calcium  score 300 (04-2020), high -ETT 04/2020 neg Alpha gal syndrome   PLAN Here for CPX -Td  2023 - Vaccines: Declined any and all  vaccines. --Prostate cancer screening: No symptoms, check PSA. - CCS: Cscope 10-2011, 1 polyp, Cscope 2017, Cscope  02/2020. Cscope 03/2023 next per GI - Tobacco: smoke from 1980 to 2020, ~ 1 ppd.  Currently denies cough or wheezing.  LDCT offered last year, did not proceed.  At this time he likes to wait until he deals with the  left neck mass. -Labs: CMP CBC TSH PSA. Other issues: L neck mass: So far biopsies have not been diagnostic, to have a excision soon. Alpha gal allergies: Saw allergy last year, requested blood work, will do but advised that this is not my specialty so he will have to discuss results with his allergist. High cholesterol: Last LDL 72, on rosuvastatin .  No change. Skin cancer, history of, sees dermatology RTC 6 months

## 2024-03-06 NOTE — Assessment & Plan Note (Signed)
 Here for CPX  Other issues: L neck mass: So far biopsies have not been diagnostic, to have a excision soon. Alpha gal allergies: Saw allergy last year, requested blood work, will do but advised that this is not my specialty so he will have to discuss results with his allergist. High cholesterol: Last LDL 72, on rosuvastatin .  No change. Skin cancer, history of, sees dermatology RTC 6 months

## 2024-03-06 NOTE — Assessment & Plan Note (Signed)
 Here for CPX -Td  2023 - Vaccines: Declined any and all  vaccines. --Prostate cancer screening: No symptoms, check PSA. - CCS: Cscope 10-2011, 1 polyp, Cscope 2017, Cscope 02/2020. Cscope 03/2023 next per GI - Tobacco: smoke from 1980 to 2020, ~ 1 ppd.  Currently denies cough or wheezing.  LDCT offered last year, did not proceed.  At this time he likes to wait until he deals with the  left neck mass. -Labs: CMP CBC TSH PSA.

## 2024-03-06 NOTE — Patient Instructions (Addendum)
   GO TO THE LAB : Get the blood work     Next office visit for a checkup in 6 months Please make an appointment before you leave today

## 2024-03-08 ENCOUNTER — Ambulatory Visit: Payer: Self-pay | Admitting: Internal Medicine

## 2024-03-09 LAB — ALPHA-GAL PANEL
Allergen, Mutton, f88: 0.17 kU/L — ABNORMAL HIGH
Allergen, Pork, f26: <0.1 kU/L
Beef: 0.19 kU/L — ABNORMAL HIGH
CLASS: 0
GALACTOSE-ALPHA-1,3-GALACTOSE IGE*: 0.47 kU/L — ABNORMAL HIGH (ref <0.10)

## 2024-03-09 LAB — INTERPRETATION:

## 2024-03-24 ENCOUNTER — Ambulatory Visit (HOSPITAL_COMMUNITY): Admit: 2024-03-24 | Admitting: Otolaryngology

## 2024-03-24 SURGERY — EXCISION, MASS, NECK
Anesthesia: General | Laterality: Left

## 2024-03-27 NOTE — Progress Notes (Signed)
 CARDIOLOGY CONSULT NOTE       Patient ID: Stanley Petty MRN: [id number] DOB/AGE: February 14, 1966 58 y.o.  Admit date: (Not on file) Referring Physician: Neomi Banks Primary Physician: Ezell Hollow, MD Primary Cardiologist: Stann Earnest Reason for Consultation: Family history of CAD    HPI:  58 y.o. referred by DR Neomi Banks 04/30/20 for premature family history of CAD. Both father and paternal uncle had MI/CAD in 84's Quit smoking August 2020 but still sneaks some when he has beer  Long standing history of GERD/Esophagitis and father with history of esophageal cancer as well  Calcium  Score 05/21/20 300 91 st percentile for age and sex   Called office 11/27/20 with pre syncope no chest pain or palpitations 2 Episodes one Friday and one Wednesday lasting minutes Seemed more GI / esophageal in nature with vagal component   Sees Pyrtle EGD 03/13/20 showed esophagitis/gastritis and family history of esophageal  Cancer Taking famotidine  and stopped ASA and pre syncopal feeling gone Primary wanted him on enteric coated asa and to f/u for EGD and or upper GI series   Myalgias started with lipitor even though he's been on it for a year Changed to crestor  02/20/22 LDL  72 and LpA 12 12/14/23  Vascular screening bundle 11/24/22 normal no PVD, carotids ok and no AAA  I take care of his dad Olyver who owns [employer] and [place] works for him Had left rib contusion while working 09/01/22 and plain films negative    Has had two left neck biopsies negative for cancer Seeing Dr Berwyn Broker for ENT no surgery planned   ROS All other systems reviewed and negative except as noted above  Past Medical History:  Diagnosis Date   Adenomatous colon polyp    Allergy to alpha-gal    Duodenitis    Gastritis    GERD (gastroesophageal reflux disease)    IBS (irritable bowel syndrome)    Dr Andriette Keeling   Internal hemorrhoids    Skin cancer 03/2018   L hand, ?BCC    Family History  Problem Relation Age of Onset   Colon polyps Mother         also gallstones   Heart disease Father 38       MI, stent age 21   Prostate cancer Father        dx ~ 64   Esophageal cancer Father 92   Hyperlipidemia Sister    Coronary artery disease Paternal Uncle    Colon cancer Maternal Grandfather    Diabetes Neg Hx    Stomach cancer Neg Hx    Rectal cancer Neg Hx     Social History   Socioeconomic History   Marital status: Married    Spouse name: Not on file   Number of children: 2   Years of education: Not on file   Highest education level: Not on file  Occupational History   Occupation: plumbing, has his own co    Comment: [employer]  Tobacco Use   Smoking status: Former    Current packs/day: 0.00    Types: Cigarettes    Quit date: 05/31/2019    Years since quitting: 4.8   Smokeless tobacco: Never   Tobacco comments:     Quit tobacco ~ 2020    1 ppd  Vaping Use   Vaping status: Never Used  Substance and Sexual Activity   Alcohol use: Yes    Alcohol/week: 0.0 standard drinks of alcohol    Comment: socially    Drug  use: No   Sexual activity: Yes  Other Topics Concern   Not on file  Social History Narrative   2 children: 1987, 2008   Household: pt, wife, son   Social Drivers of Corporate investment banker Strain: Not on file  Food Insecurity: Not on file  Transportation Needs: Not on file  Physical Activity: Not on file  Stress: Not on file  Social Connections: Not on file  Intimate Partner Violence: Not on file    Past Surgical History:  Procedure Laterality Date   CHOLECYSTECTOMY N/A 01/15/2017   Procedure: LAPAROSCOPIC CHOLECYSTECTOMY;  Surgeon: Shela Derby, MD;  Location: [facility];  Service: General;  Laterality: N/A;   COLONOSCOPY     CYSTOSCOPY     Dr Bosie Bye   HEMORRHOID BANDING     [place]    MULTIPLE TOOTH EXTRACTIONS     SURGERY ON TESTICLE     at age 68      Current Outpatient Medications:    diltiazem  2 % GEL, Apply 1 Application topically 2 (two) times daily. Using your index finger, apply a  pea sized amount of medication inside the rectum up to your first knuckle/joint twice daily x 4 weeks. (Patient taking differently: Apply 1 Application topically 2 (two) times daily. Using your index finger, apply a pea sized amount of medication inside the rectum up to your first knuckle/joint twice daily x 4 weeks.), Disp: 30 g, Rfl: 0   EPINEPHrine 0.3 mg/0.3 mL IJ SOAJ injection, Inject 0.3 mg into the muscle as needed for anaphylaxis (ALPHA-GAL)., Disp: , Rfl:    famotidine  (PEPCID ) 20 MG tablet, Take 1 tablet (20 mg total) by mouth 2 (two) times daily. (Patient taking differently: Take 1 tablet (20 mg total) by mouth 2 (two) times daily.), Disp: 180 tablet, Rfl: 3   rosuvastatin  (CRESTOR ) 10 MG tablet, Take 1 tablet by mouth once daily, Disp: 90 tablet, Rfl: 3   aspirin EC 81 MG tablet, Take 81 mg by mouth daily. (Patient not taking: Reported on 04/07/2024), Disp: , Rfl:    meloxicam  (MOBIC ) 7.5 MG tablet, Take 1 tablet (7.5 mg total) by mouth daily. (Patient not taking: Reported on 04/07/2024), Disp: 14 tablet, Rfl: 0    Physical Exam: Blood pressure 108/70, pulse (!) 50, height 6' 4 (1.93 m), weight 168 lb (76.2 kg), SpO2 97%.    Affect appropriate Healthy:  appears stated age HEENT: left neck mass not palpable today Neck supple with no adenopathy JVP normal no bruits no thyromegaly Lungs clear with no wheezing and good diaphragmatic motion Heart:  S1/S2 no murmur, no rub, gallop or click PMI normal Abdomen: benighn, BS positve, no tenderness, no AAA no bruit.  No HSM or HJR post cholecystectomy  Distal pulses intact with no bruits No edema Neuro non-focal Skin warm and dry No muscular weakness   Labs:   Lab Results  Component Value Date   WBC 8.0 03/06/2024   HGB 16.1 03/06/2024   HCT 48.8 03/06/2024   MCV 96.9 03/06/2024   PLT 240.0 03/06/2024   No results for input(s): NA, K, CL, CO2, BUN, CREATININE, CALCIUM , PROT, BILITOT, ALKPHOS, ALT, AST,  GLUCOSE in the last 168 hours.  Invalid input(s): LABALBU Lab Results  Component Value Date   CKTOTAL 88 06/11/2020   TROPONINI <0.01 06/15/2016    Lab Results  Component Value Date   CHOL 131 12/14/2023   CHOL 123 11/13/2022   CHOL 124 05/28/2022   Lab Results  Component Value Date  HDL 47.50 12/14/2023   HDL 51 11/13/2022   HDL 45 05/28/2022   Lab Results  Component Value Date   LDLCALC 72 12/14/2023   LDLCALC 61 11/13/2022   LDLCALC 67 05/28/2022   Lab Results  Component Value Date   TRIG 58.0 12/14/2023   TRIG 49 11/13/2022   TRIG 55 05/28/2022   Lab Results  Component Value Date   CHOLHDL 3 12/14/2023   CHOLHDL 2.4 11/13/2022   CHOLHDL 2.8 05/28/2022   No results found for: LDLDIRECT    Radiology: No results found.  EKG: 04/07/2024 SR rate 56 normal    ASSESSMENT AND PLAN:   1. Family History Premature CAD:  Elevated calcium  score normal ETT 05/21/20 continue statin and ASA LDL 72 with low LpA   Changed lipitor to crestor  due to myalgias Vascular screening bundle negative  2. GERD:  Low carb diet continue pepcid /prilosec f/u Dr Bridgett Camps see below Alpha Gal positive   3. Pre Syncope:  ECG has been normal pulse not slow Sounds like esophageal spasm causing Vagal episodes.    4. Left neck mass:  path left cervicle node negative 02/12/24  F/U [facility] ? Need for PET scan    F/U ina year    Signed: Janelle Mediate 04/07/2024, 8:16 AM

## 2024-04-07 ENCOUNTER — Ambulatory Visit
Payer: No Typology Code available for payment source | Attending: Cardiovascular Disease | Admitting: Cardiovascular Disease

## 2024-04-07 ENCOUNTER — Encounter: Payer: Self-pay | Admitting: Cardiovascular Disease

## 2024-04-07 VITALS — BP 108/70 | HR 50 | Ht 76.0 in | Wt 168.0 lb

## 2024-04-07 DIAGNOSIS — E785 Hyperlipidemia, unspecified: Secondary | ICD-10-CM | POA: Diagnosis not present

## 2024-04-07 DIAGNOSIS — Z8249 Family history of ischemic heart disease and other diseases of the circulatory system: Secondary | ICD-10-CM | POA: Diagnosis not present

## 2024-04-07 NOTE — Patient Instructions (Signed)

## 2024-05-10 ENCOUNTER — Encounter: Payer: Self-pay | Admitting: Internal Medicine

## 2024-05-10 ENCOUNTER — Other Ambulatory Visit (INDEPENDENT_AMBULATORY_CARE_PROVIDER_SITE_OTHER)

## 2024-05-10 ENCOUNTER — Ambulatory Visit: Admitting: Internal Medicine

## 2024-05-10 VITALS — BP 110/82 | HR 55 | Ht 76.0 in | Wt 169.1 lb

## 2024-05-10 DIAGNOSIS — K2980 Duodenitis without bleeding: Secondary | ICD-10-CM

## 2024-05-10 DIAGNOSIS — K602 Anal fissure, unspecified: Secondary | ICD-10-CM

## 2024-05-10 DIAGNOSIS — Z91018 Allergy to other foods: Secondary | ICD-10-CM

## 2024-05-10 DIAGNOSIS — K219 Gastro-esophageal reflux disease without esophagitis: Secondary | ICD-10-CM

## 2024-05-10 DIAGNOSIS — Z91014 Allergy to mammalian meats: Secondary | ICD-10-CM

## 2024-05-10 DIAGNOSIS — Z860100 Personal history of colon polyps, unspecified: Secondary | ICD-10-CM

## 2024-05-10 DIAGNOSIS — K648 Other hemorrhoids: Secondary | ICD-10-CM

## 2024-05-10 DIAGNOSIS — D72820 Lymphocytosis (symptomatic): Secondary | ICD-10-CM | POA: Diagnosis not present

## 2024-05-10 DIAGNOSIS — K2100 Gastro-esophageal reflux disease with esophagitis, without bleeding: Secondary | ICD-10-CM

## 2024-05-10 MED ORDER — DILTIAZEM GEL 2 %: 1.0000 | Freq: Two times a day (BID) | CUTANEOUS | 0 refills | Status: AC

## 2024-05-10 NOTE — Progress Notes (Signed)
 Subjective:    Patient ID: Stanley Petty, male    DOB: 12/07/1965, 58 y.o.   MRN: [id number]  HPI Stanley Petty is a 58 year old male with a history of GERD with esophagitis, H. pylori negative gastroduodenitis, alpha-gal allergy, history of adenomatous colon polyps, internal hemorrhoid, anal fissure, family history of esophageal cancer in his father, prior cholecystectomy for gallbladder disease who is here for follow-up.  He is here alone today.  He underwent a colonoscopy on April 05, 2023, due to a personal history of adenomatous polyps, including adenomas greater than a centimeter. The colonoscopy revealed a normal terminal ileum, a 4 mm ascending polyp, and diffuse erythematous mucosa in the sigmoid, which was biopsied. He also had medium-sized internal hemorrhoids. The polyp was hyperplastic, and the left colon biopsies did not show significant inflammation. An upper endoscopy on the same day as the colonoscopy revealed a normal esophagus, a 2 cm hiatal hernia, and patchy erythematous mucosa in the gastric antrum. Biopsies were benign without H. pylori. There was mildly diffuse erythema in the duodenal bulb, but the second portion of the duodenum was normal. Duodenal biopsies showed minimally increased intraepithelial lymphocytes without villous blunting and mild reactive reparative change.  He continues to have positive IgG to alpha-gal and elevated allergen levels to beef and mutton. He has been avoiding beef and pork since the diagnosis. He experiences intermittent diarrhea and describes his bowel movements as 'mushy' but not frequent. He uses Benefiber to help with bowel consistency, which he finds helpful.  He experiences anal discomfort, describing it as a burning sensation, especially after bowel movements, and notes that sitting and driving exacerbate the pain. He suspects a fissure or hemorrhoid as the cause. He has used a diltiazem  gel for relief, but it sometimes causes a burning  sensation upon application.  He also has never used it for longer than 1 or 2 days in a row.  He uses Aquaphor to for perianal discomfort.  His reflux symptoms are well-controlled, and he takes famotidine  medication as needed, though he cannot recall the last time he needed it. No recent heartburn or significant reflux issues.  He has a history of a lymph node biopsy that was inconclusive, and he has experienced some pain in the area under his left jawline. He underwent an ultrasound and CT scan, followed by a core biopsy, which was not cancerous. He has a small papilla on the back of his tongue, which has been evaluated but not removed.  He follows with Dr. Carlie.   Review of Systems As per HPI, otherwise negative  Current Medications, Allergies, Past Medical History, Past Surgical History, Family History and Social History were reviewed in [employer] record.     Objective:   Physical Exam BP 110/82   Pulse (!) 55   Ht 6' 4 (1.93 m)   Wt 169 lb 2 oz (76.7 kg)   SpO2 95%   BMI 20.59 kg/m  Gen: awake, alert, NAD HEENT: anicteric  Ext: no c/c/e Neuro: nonfocal  LABS Alpha-gal Panel: Positive IgG to alpha-1,3-galactose, elevated allergen to beef and mutton (03/06/2024)  DIAGNOSTIC Colonoscopy: Normal terminal ileum, 4 mm ascending polyp, diffuse erythematous mucosa in sigmoid, medium-sized internal hemorrhoids (04/05/2023) Esophagogastroduodenoscopy (EGD): Normal esophagus, 2 cm hiatal hernia, patchy erythematous mucosa in gastric antrum, mildly diffuse erythema in duodenal bulb, normal second portion of duodenum (04/05/2023)  PATHOLOGY Sigmoid Biopsy: No significant inflammation (04/05/2023) Polyp Biopsy: Hyperplastic polyp (04/05/2023) Gastric Antrum Biopsy: Benign, no H. pylori (04/05/2023)  Duodenal Biopsy: Minimally increased intraepithelial lymphocytes, mild reactive reparative change, nonspecific etiologies (04/05/2023)  Lymph Node Biopsy from left  submandibular lymph node: Inconclusive Core Biopsy: No malignancy      Assessment & Plan:  58 year old male with a history of GERD with esophagitis, H. pylori negative gastroduodenitis, alpha-gal allergy, history of adenomatous colon polyps, internal hemorrhoid, anal fissure, family history of esophageal cancer in his father, prior cholecystectomy for gallbladder disease who is here for follow-up.  Anal fissure Chronic anal fissure with post-defecation pain, likely posterior. Previous diltiazem  gel use was inconsistent but provided relief. - Prescribed diltiazem  gel 2% twice daily for 2-3 weeks, continue 7 days post-symptom resolution. - Recommend mixing diltiazem  gel with Recticare for pain relief. - Advise Recticare up to four times daily as needed for pain. - Encourage consistent Benefiber use to bulk stools.  Internal hemorrhoids Medium-sized internal hemorrhoids causing post-defecation pressure and burning. Banding not recommended until fissure heals.  He has had banding in the past and I am not sure this needs to be done as most symptoms consistent with fissure. - Advise against hemorrhoid banding until fissure heals. - Consider hydrocortisone suppositories post-fissure healing.  Alpha-gal syndrome Persistent alpha-gal syndrome with positive IgG and elevated allergen levels. Symptoms managed with dietary avoidance. - Continue avoidance of beef and pork. - Reassured antibody levels may remain elevated.  GERD GERD symptoms well-controlled with famotidine . - Continue famotidine  20 mg twice daily as needed.  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

## 2024-05-10 NOTE — Patient Instructions (Signed)
 Your provider has requested that you go to the basement level for lab work before leaving today. Press B on the elevator. The lab is located at the first door on the left as you exit the elevator.  Continue to not eat pork or beef.   We have given you recti-care to apply to rectum as needed.   We have given you a prescription for diltiazem  gel to [facility]. You should apply a pea size amount to your rectum three times daily x 6-8 weeks.  Continue famotidine  daily.   Restart Benefiber daily.   _______________________________________________________  If your blood pressure at your visit was 140/90 or greater, please contact your primary care physician to follow up on this.  _______________________________________________________  If you are age 67 or older, your body mass index should be between 23-30. Your Body mass index is 20.59 kg/m. If this is out of the aforementioned range listed, please consider follow up with your Primary Care Provider.  If you are age 30 or younger, your body mass index should be between 19-25. Your Body mass index is 20.59 kg/m. If this is out of the aformentioned range listed, please consider follow up with your Primary Care Provider.   ________________________________________________________  The [facility] providers would like to encourage you to use MYCHART to communicate with providers for non-urgent requests or questions.  Due to long hold times on the telephone, sending your provider a message by [facility] may be a faster and more efficient way to get a response.  Please allow 48 business hours for a response.  Please remember that this is for non-urgent requests.  _______________________________________________________

## 2024-05-11 LAB — TISSUE TRANSGLUTAMINASE, IGA: (tTG) Ab, IgA: <1 U/mL

## 2024-05-11 LAB — IGA: Immunoglobulin A: 203 mg/dL (ref 47–310)

## 2024-05-15 ENCOUNTER — Ambulatory Visit: Payer: Self-pay | Admitting: Internal Medicine

## 2024-07-03 ENCOUNTER — Encounter: Payer: Self-pay | Admitting: Cardiovascular Disease

## 2024-07-03 ENCOUNTER — Other Ambulatory Visit: Payer: Self-pay | Admitting: Cardiovascular Disease

## 2024-07-03 ENCOUNTER — Other Ambulatory Visit: Payer: Self-pay | Admitting: *Deleted

## 2024-07-03 MED ORDER — ROSUVASTATIN CALCIUM 10 MG PO TABS: 10.0000 mg | ORAL_TABLET | Freq: Every day | ORAL | 3 refills | Status: AC

## 2024-07-03 NOTE — Progress Notes (Signed)
 Called and made patient aware that Per Dr. Nishan Rosuvastatin  10 mg has been sent to pharmacy.

## 2024-08-28 ENCOUNTER — Other Ambulatory Visit (HOSPITAL_COMMUNITY): Payer: Self-pay | Admitting: Otolaryngology

## 2024-08-28 DIAGNOSIS — R221 Localized swelling, mass and lump, neck: Secondary | ICD-10-CM

## 2024-08-28 DIAGNOSIS — D105 Benign neoplasm of other parts of oropharynx: Secondary | ICD-10-CM

## 2024-09-05 ENCOUNTER — Other Ambulatory Visit (HOSPITAL_BASED_OUTPATIENT_CLINIC_OR_DEPARTMENT_OTHER): Admitting: Radiology

## 2024-09-06 ENCOUNTER — Ambulatory Visit: Admitting: Internal Medicine

## 2024-09-08 ENCOUNTER — Ambulatory Visit (HOSPITAL_BASED_OUTPATIENT_CLINIC_OR_DEPARTMENT_OTHER)
Admission: RE | Admit: 2024-09-08 | Discharge: 2024-09-08 | Disposition: A | Discharge status: Home / Self Care | Source: Ambulatory Visit | Attending: Otolaryngology | Admitting: Otolaryngology

## 2024-09-08 ENCOUNTER — Encounter (HOSPITAL_BASED_OUTPATIENT_CLINIC_OR_DEPARTMENT_OTHER): Payer: Self-pay | Admitting: Radiology

## 2024-09-08 DIAGNOSIS — R221 Localized swelling, mass and lump, neck: Secondary | ICD-10-CM

## 2024-09-08 DIAGNOSIS — D105 Benign neoplasm of other parts of oropharynx: Secondary | ICD-10-CM | POA: Diagnosis not present

## 2024-09-08 MED ORDER — IOHEXOL 300 MG/ML  SOLN
75.0000 mL | Freq: Once | INTRAMUSCULAR | Status: AC | PRN
  Administered 2024-09-08: 75 mL via INTRAVENOUS
